# Patient Record
Sex: Female | Born: 1990 | Race: White | Hispanic: No | Marital: Married | Smoking: Former smoker
Health system: Southern US, Community
[De-identification: ages and names within clinical notes are randomized; demographics above are authoritative.]

## PROBLEM LIST (undated history)

## (undated) ENCOUNTER — Inpatient Hospital Stay (HOSPITAL_COMMUNITY): Payer: Self-pay

## (undated) DIAGNOSIS — N200 Calculus of kidney: Secondary | ICD-10-CM

## (undated) DIAGNOSIS — Z800 Family history of malignant neoplasm of digestive organs: Secondary | ICD-10-CM

## (undated) DIAGNOSIS — D649 Anemia, unspecified: Secondary | ICD-10-CM

## (undated) DIAGNOSIS — F32A Depression, unspecified: Secondary | ICD-10-CM

## (undated) DIAGNOSIS — O021 Missed abortion: Secondary | ICD-10-CM

## (undated) DIAGNOSIS — F329 Major depressive disorder, single episode, unspecified: Secondary | ICD-10-CM

## (undated) DIAGNOSIS — F419 Anxiety disorder, unspecified: Secondary | ICD-10-CM

## (undated) DIAGNOSIS — Z8041 Family history of malignant neoplasm of ovary: Secondary | ICD-10-CM

## (undated) DIAGNOSIS — N926 Irregular menstruation, unspecified: Secondary | ICD-10-CM

## (undated) DIAGNOSIS — H3530 Unspecified macular degeneration: Secondary | ICD-10-CM

## (undated) DIAGNOSIS — G4700 Insomnia, unspecified: Secondary | ICD-10-CM

## (undated) DIAGNOSIS — E611 Iron deficiency: Secondary | ICD-10-CM

## (undated) DIAGNOSIS — F988 Other specified behavioral and emotional disorders with onset usually occurring in childhood and adolescence: Secondary | ICD-10-CM

## (undated) DIAGNOSIS — R002 Palpitations: Secondary | ICD-10-CM

## (undated) DIAGNOSIS — Z87442 Personal history of urinary calculi: Secondary | ICD-10-CM

## (undated) DIAGNOSIS — Z803 Family history of malignant neoplasm of breast: Secondary | ICD-10-CM

## (undated) DIAGNOSIS — K219 Gastro-esophageal reflux disease without esophagitis: Secondary | ICD-10-CM

## (undated) DIAGNOSIS — Z3493 Encounter for supervision of normal pregnancy, unspecified, third trimester: Secondary | ICD-10-CM

## (undated) DIAGNOSIS — F410 Panic disorder [episodic paroxysmal anxiety] without agoraphobia: Secondary | ICD-10-CM

## (undated) DIAGNOSIS — N912 Amenorrhea, unspecified: Secondary | ICD-10-CM

## (undated) DIAGNOSIS — G43909 Migraine, unspecified, not intractable, without status migrainosus: Secondary | ICD-10-CM

## (undated) DIAGNOSIS — R5382 Chronic fatigue, unspecified: Secondary | ICD-10-CM

## (undated) HISTORY — DX: Irregular menstruation, unspecified: N92.6

## (undated) HISTORY — DX: Chronic fatigue, unspecified: R53.82

## (undated) HISTORY — DX: Other specified behavioral and emotional disorders with onset usually occurring in childhood and adolescence: F98.8

## (undated) HISTORY — DX: Family history of malignant neoplasm of digestive organs: Z80.0

## (undated) HISTORY — DX: Insomnia, unspecified: G47.00

## (undated) HISTORY — DX: Amenorrhea, unspecified: N91.2

## (undated) HISTORY — DX: Iron deficiency: E61.1

## (undated) HISTORY — DX: Migraine, unspecified, not intractable, without status migrainosus: G43.909

## (undated) HISTORY — DX: Calculus of kidney: N20.0

## (undated) HISTORY — DX: Family history of malignant neoplasm of ovary: Z80.41

## (undated) HISTORY — DX: Unspecified macular degeneration: H35.30

## (undated) HISTORY — DX: Panic disorder (episodic paroxysmal anxiety): F41.0

## (undated) HISTORY — DX: Palpitations: R00.2

## (undated) HISTORY — DX: Family history of malignant neoplasm of breast: Z80.3

## (undated) HISTORY — PX: OTHER SURGICAL HISTORY: SHX169

---

## 2013-12-29 ENCOUNTER — Emergency Department (HOSPITAL_COMMUNITY)
Admission: EM | Admit: 2013-12-29 | Discharge: 2013-12-30 | Disposition: A | Discharge status: Home / Self Care | Payer: PRIVATE HEALTH INSURANCE | Attending: Emergency Medicine | Admitting: Emergency Medicine

## 2013-12-29 ENCOUNTER — Encounter (HOSPITAL_COMMUNITY): Payer: Self-pay | Admitting: Emergency Medicine

## 2013-12-29 DIAGNOSIS — O2340 Unspecified infection of urinary tract in pregnancy, unspecified trimester: Secondary | ICD-10-CM

## 2013-12-29 DIAGNOSIS — O2390 Unspecified genitourinary tract infection in pregnancy, unspecified trimester: Secondary | ICD-10-CM | POA: Insufficient documentation

## 2013-12-29 DIAGNOSIS — N390 Urinary tract infection, site not specified: Secondary | ICD-10-CM | POA: Insufficient documentation

## 2013-12-29 DIAGNOSIS — R52 Pain, unspecified: Secondary | ICD-10-CM | POA: Insufficient documentation

## 2013-12-29 DIAGNOSIS — Z87442 Personal history of urinary calculi: Secondary | ICD-10-CM | POA: Insufficient documentation

## 2013-12-29 DIAGNOSIS — Z87891 Personal history of nicotine dependence: Secondary | ICD-10-CM | POA: Insufficient documentation

## 2013-12-29 DIAGNOSIS — R000 Tachycardia, unspecified: Secondary | ICD-10-CM | POA: Insufficient documentation

## 2013-12-29 LAB — CBC WITH DIFFERENTIAL/PLATELET
BASOS ABS: 0 10*3/uL (ref 0.0–0.1)
Basophils Relative: 0 % (ref 0–1)
EOS PCT: 0 % (ref 0–5)
Eosinophils Absolute: 0 10*3/uL (ref 0.0–0.7)
HCT: 35.7 % — ABNORMAL LOW (ref 36.0–46.0)
Hemoglobin: 12.5 g/dL (ref 12.0–15.0)
LYMPHS PCT: 5 % — AB (ref 12–46)
Lymphs Abs: 0.4 10*3/uL — ABNORMAL LOW (ref 0.7–4.0)
MCH: 30.9 pg (ref 26.0–34.0)
MCHC: 35 g/dL (ref 30.0–36.0)
MCV: 88.1 fL (ref 78.0–100.0)
Monocytes Absolute: 0.8 10*3/uL (ref 0.1–1.0)
Monocytes Relative: 10 % (ref 3–12)
NEUTROS ABS: 6.4 10*3/uL (ref 1.7–7.7)
NEUTROS PCT: 85 % — AB (ref 43–77)
PLATELETS: 173 10*3/uL (ref 150–400)
RBC: 4.05 MIL/uL (ref 3.87–5.11)
RDW: 12.4 % (ref 11.5–15.5)
WBC: 7.5 10*3/uL (ref 4.0–10.5)

## 2013-12-29 LAB — COMPREHENSIVE METABOLIC PANEL
ALK PHOS: 58 U/L (ref 39–117)
ALT: 17 U/L (ref 0–35)
AST: 17 U/L (ref 0–37)
Albumin: 4 g/dL (ref 3.5–5.2)
BUN: 9 mg/dL (ref 6–23)
CALCIUM: 9.3 mg/dL (ref 8.4–10.5)
CO2: 21 meq/L (ref 19–32)
Chloride: 97 mEq/L (ref 96–112)
Creatinine, Ser: 0.66 mg/dL (ref 0.50–1.10)
GFR calc Af Amer: >90 mL/min (ref >90)
Glucose, Bld: 95 mg/dL (ref 70–99)
POTASSIUM: 3.2 meq/L — AB (ref 3.7–5.3)
SODIUM: 133 meq/L — AB (ref 137–147)
Total Bilirubin: 0.4 mg/dL (ref 0.3–1.2)
Total Protein: 7.2 g/dL (ref 6.0–8.3)

## 2013-12-29 LAB — HCG, QUANTITATIVE, PREGNANCY: HCG, BETA CHAIN, QUANT, S: 353 m[IU]/mL — AB (ref <5)

## 2013-12-29 MED ORDER — ONDANSETRON HCL 4 MG/2ML IJ SOLN
4.0000 mg | Freq: Once | INTRAMUSCULAR | Status: AC
  Administered 2013-12-29: 4 mg via INTRAVENOUS
  Filled 2013-12-29: qty 2

## 2013-12-29 MED ORDER — SODIUM CHLORIDE 0.9 % IV BOLUS (SEPSIS)
1000.0000 mL | Freq: Once | INTRAVENOUS | Status: AC
  Administered 2013-12-29: 1000 mL via INTRAVENOUS

## 2013-12-29 NOTE — ED Provider Notes (Signed)
CSN: [id number]     Arrival date & time 12/29/13  2153 History   First MD Initiated Contact with Patient 12/29/13 2201     Chief Complaint  Patient presents with  . Fever  . Generalized Body Aches  . Urinary Tract Infection     (Consider location/radiation/quality/duration/timing/severity/associated sxs/prior Treatment) HPI Comments: Patient is a G93 P38 23 year old female presenting to the emergency department for 6 days of dysuria. Patient states last evening she developed some lower abdominal discomfort with radiation to the low back, along with fever (TMAX 101F), myalgias today. Patient states she's tried Tylenol at home with no improvement of her symptoms. Alleviating or aggravating factors. Patient denies any nausea, vomiting, vaginal bleeding or discharge. No OB/GYN.  Patient is a 23 y.o. female presenting with fever and urinary tract infection.  Fever Associated symptoms: chills and dysuria   Associated symptoms: no diarrhea and no nausea   Urinary Tract Infection Associated symptoms include abdominal pain, chills and a fever. Pertinent negatives include no nausea.    Past Medical History  Diagnosis Date  . Kidney stone    Past Surgical History  Procedure Laterality Date  . Kidney stent     No family history on file. History  Substance Use Topics  . Smoking status: Former Research scientist (life sciences)  . Smokeless tobacco: Never Used  . Alcohol Use: No   OB History   Grav Para Term Preterm Abortions TAB SAB Ect Mult Living   1              Review of Systems  Constitutional: Positive for fever and chills.  Gastrointestinal: Positive for abdominal pain. Negative for nausea, diarrhea and constipation.  Genitourinary: Positive for dysuria, urgency and decreased urine volume. Negative for hematuria, flank pain, vaginal bleeding, vaginal discharge, vaginal pain, menstrual problem and pelvic pain.  Musculoskeletal: Positive for back pain.  All other systems reviewed and are  negative.     Allergies  Review of patient's allergies indicates not on file.  Home Medications   Prior to Admission medications   Not on File   BP 135/85  Pulse 130  Temp(Src) 99 F (37.2 C) (Oral)  Resp 24  SpO2 97%  LMP 12/01/2013 Physical Exam  Nursing note and vitals reviewed. Constitutional: She is oriented to person, place, and time. She appears well-developed and well-nourished. No distress.  Patient tearful.   HENT:  Head: Normocephalic and atraumatic.  Right Ear: External ear normal.  Left Ear: External ear normal.  Nose: Nose normal.  Mouth/Throat: Oropharynx is clear and moist. No oropharyngeal exudate.  Eyes: Conjunctivae are normal.  Neck: Normal range of motion. Neck supple.  Cardiovascular: Regular rhythm and normal heart sounds.  Tachycardia present.   Pulmonary/Chest: Effort normal and breath sounds normal. No respiratory distress.  Abdominal: Soft. Bowel sounds are normal. She exhibits no distension. There is tenderness (mildly tender in lower quadrants). There is no rigidity, no rebound, no guarding and no CVA tenderness.  Musculoskeletal: Normal range of motion.  Neurological: She is alert and oriented to person, place, and time.  Skin: Skin is warm and dry. She is not diaphoretic.  Psychiatric: She has a normal mood and affect.    ED Course  Procedures (including critical care time) Medications  sodium chloride 0.9 % bolus 1,000 mL (1,000 mLs Intravenous New Bag/Given 12/29/13 2255)    Labs Review Labs Reviewed - No data to display  Imaging Review No results found.   EKG Interpretation None      MDM  Final diagnoses:  UTI in pregnancy    Filed Vitals:   12/30/13 0230  BP: 109/57  Pulse: 118  Temp:   Resp: 16   Afebrile, NAD, non-toxic appearing, AAOx4.  Patient presenting with 1 week of dysuria with one day of fevers, chills, and suprapubic abdominal discomfort. Patient initially very anxious, tearful, and tachycardiac upon  arrival. Abdomen is soft, mildly tender in suprapubic region, no peritoneal signs. No CVA tenderness. Pelvic exam is unremarkable. Screening labs without acute abnormality. No gross leukocytosis. Urine results of evidence of infection. Tachycardia improved with IV fluids. Advised patient to followup with OB/GYN to establish care. The patient was signed out to [employer], PA-C and improvement in tachycardia with IV fluids. Patient d/w with Dr. Lita Mains, agrees with plan.    Harlow Mares, PA-C 12/30/13 1510

## 2013-12-29 NOTE — ED Notes (Signed)
Pt states last Thursday started having burning and frequency w/ urination, states that's what prompted her to take a pregnancy test which came back positive, states she is between 3-[redacted] weeks pregnant, has not seen her OB/GYN yet, states today started having fever, body aches, lower abdominal cramping and lower back pain, pt states headache also present, has taken tylenol twice today, states last time took was 1 extra strength tylenol and a tylenol PM around 2115.

## 2013-12-30 LAB — URINALYSIS, ROUTINE W REFLEX MICROSCOPIC
Bilirubin Urine: NEGATIVE
GLUCOSE, UA: NEGATIVE mg/dL
Ketones, ur: 40 mg/dL — AB
NITRITE: NEGATIVE
PH: 6.5 (ref 5.0–8.0)
Protein, ur: NEGATIVE mg/dL
SPECIFIC GRAVITY, URINE: 1.005 (ref 1.005–1.030)
Urobilinogen, UA: 0.2 mg/dL (ref 0.0–1.0)

## 2013-12-30 LAB — URINE MICROSCOPIC-ADD ON

## 2013-12-30 LAB — WET PREP, GENITAL
CLUE CELLS WET PREP: NONE SEEN
Trich, Wet Prep: NONE SEEN
WBC, Wet Prep HPF POC: NONE SEEN
Yeast Wet Prep HPF POC: NONE SEEN

## 2013-12-30 LAB — GC/CHLAMYDIA PROBE AMP
CT Probe RNA: NEGATIVE
GC PROBE AMP APTIMA: NEGATIVE

## 2013-12-30 LAB — PREGNANCY, URINE: PREG TEST UR: POSITIVE — AB

## 2013-12-30 LAB — HIV ANTIBODY (ROUTINE TESTING W REFLEX): HIV 1&2 Ab, 4th Generation: NONREACTIVE

## 2013-12-30 MED ORDER — ONDANSETRON HCL 4 MG PO TABS: 4.0000 mg | ORAL_TABLET | Freq: Four times a day (QID) | ORAL | Status: DC

## 2013-12-30 MED ORDER — NITROFURANTOIN MONOHYD MACRO 100 MG PO CAPS
100.0000 mg | ORAL_CAPSULE | Freq: Once | ORAL | Status: AC
  Administered 2013-12-30: 100 mg via ORAL
  Filled 2013-12-30: qty 1

## 2013-12-30 MED ORDER — NITROFURANTOIN MONOHYD MACRO 100 MG PO CAPS: 100.0000 mg | ORAL_CAPSULE | Freq: Two times a day (BID) | ORAL | Status: DC
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
## 2013-12-30 NOTE — ED Notes (Signed)
See downtown forms for previous notes.

## 2014-01-02 NOTE — ED Provider Notes (Signed)
Medical screening examination/treatment/procedure(s) were performed by non-physician practitioner and as supervising physician I was immediately available for consultation/collaboration.   EKG Interpretation None       Jasper Riling. Alvino Chapel, MD 01/02/14 [phone]

## 2014-02-21 ENCOUNTER — Inpatient Hospital Stay (HOSPITAL_COMMUNITY)
Admission: EM | Admit: 2014-02-21 | Discharge: 2014-02-22 | DRG: 781 | Disposition: A | Discharge status: Home / Self Care | Payer: 59 | Attending: Internal Medicine | Admitting: Internal Medicine

## 2014-02-21 ENCOUNTER — Inpatient Hospital Stay (HOSPITAL_COMMUNITY): Payer: 59

## 2014-02-21 ENCOUNTER — Emergency Department (HOSPITAL_COMMUNITY): Payer: 59

## 2014-02-21 ENCOUNTER — Encounter (HOSPITAL_COMMUNITY): Payer: Self-pay | Admitting: Emergency Medicine

## 2014-02-21 DIAGNOSIS — N1330 Unspecified hydronephrosis: Secondary | ICD-10-CM | POA: Diagnosis present

## 2014-02-21 DIAGNOSIS — Z3490 Encounter for supervision of normal pregnancy, unspecified, unspecified trimester: Secondary | ICD-10-CM

## 2014-02-21 DIAGNOSIS — R21 Rash and other nonspecific skin eruption: Secondary | ICD-10-CM | POA: Diagnosis present

## 2014-02-21 DIAGNOSIS — N200 Calculus of kidney: Secondary | ICD-10-CM | POA: Diagnosis present

## 2014-02-21 DIAGNOSIS — O209 Hemorrhage in early pregnancy, unspecified: Secondary | ICD-10-CM | POA: Diagnosis present

## 2014-02-21 DIAGNOSIS — O26839 Pregnancy related renal disease, unspecified trimester: Principal | ICD-10-CM | POA: Diagnosis present

## 2014-02-21 DIAGNOSIS — E876 Hypokalemia: Secondary | ICD-10-CM | POA: Diagnosis present

## 2014-02-21 DIAGNOSIS — O468X1 Other antepartum hemorrhage, first trimester: Secondary | ICD-10-CM

## 2014-02-21 DIAGNOSIS — R112 Nausea with vomiting, unspecified: Secondary | ICD-10-CM | POA: Diagnosis present

## 2014-02-21 DIAGNOSIS — D72829 Elevated white blood cell count, unspecified: Secondary | ICD-10-CM

## 2014-02-21 DIAGNOSIS — O208 Other hemorrhage in early pregnancy: Secondary | ICD-10-CM

## 2014-02-21 DIAGNOSIS — Z87442 Personal history of urinary calculi: Secondary | ICD-10-CM | POA: Diagnosis not present

## 2014-02-21 DIAGNOSIS — E871 Hypo-osmolality and hyponatremia: Secondary | ICD-10-CM | POA: Diagnosis present

## 2014-02-21 DIAGNOSIS — D649 Anemia, unspecified: Secondary | ICD-10-CM

## 2014-02-21 DIAGNOSIS — O418X10 Other specified disorders of amniotic fluid and membranes, first trimester, not applicable or unspecified: Secondary | ICD-10-CM

## 2014-02-21 DIAGNOSIS — R109 Unspecified abdominal pain: Secondary | ICD-10-CM | POA: Diagnosis present

## 2014-02-21 DIAGNOSIS — Z331 Pregnant state, incidental: Secondary | ICD-10-CM

## 2014-02-21 LAB — BASIC METABOLIC PANEL
Anion gap: 15 (ref 5–15)
BUN: 9 mg/dL (ref 6–23)
CHLORIDE: 100 meq/L (ref 96–112)
CO2: 21 meq/L (ref 19–32)
Calcium: 9.6 mg/dL (ref 8.4–10.5)
Creatinine, Ser: 0.6 mg/dL (ref 0.50–1.10)
GFR calc Af Amer: >90 mL/min (ref >90)
GFR calc non Af Amer: >90 mL/min (ref >90)
Glucose, Bld: 89 mg/dL (ref 70–99)
POTASSIUM: 3.6 meq/L — AB (ref 3.7–5.3)
Sodium: 136 mEq/L — ABNORMAL LOW (ref 137–147)

## 2014-02-21 LAB — URINE MICROSCOPIC-ADD ON

## 2014-02-21 LAB — POC URINE PREG, ED: PREG TEST UR: POSITIVE — AB

## 2014-02-21 LAB — URINALYSIS, ROUTINE W REFLEX MICROSCOPIC
Bilirubin Urine: NEGATIVE
Glucose, UA: NEGATIVE mg/dL
Ketones, ur: 40 mg/dL — AB
Leukocytes, UA: NEGATIVE
Nitrite: NEGATIVE
PH: 7 (ref 5.0–8.0)
Protein, ur: NEGATIVE mg/dL
SPECIFIC GRAVITY, URINE: 1.016 (ref 1.005–1.030)
Urobilinogen, UA: 0.2 mg/dL (ref 0.0–1.0)

## 2014-02-21 LAB — CBC
HCT: 35.1 % — ABNORMAL LOW (ref 36.0–46.0)
HEMOGLOBIN: 12.5 g/dL (ref 12.0–15.0)
MCH: 30.9 pg (ref 26.0–34.0)
MCHC: 35.6 g/dL (ref 30.0–36.0)
MCV: 86.7 fL (ref 78.0–100.0)
Platelets: 189 10*3/uL (ref 150–400)
RBC: 4.05 MIL/uL (ref 3.87–5.11)
RDW: 13 % (ref 11.5–15.5)
WBC: 9.1 10*3/uL (ref 4.0–10.5)

## 2014-02-21 MED ORDER — OXYCODONE HCL 5 MG PO TABS
5.0000 mg | ORAL_TABLET | ORAL | Status: DC | PRN
  Filled 2014-02-21: qty 1

## 2014-02-21 MED ORDER — ACETAMINOPHEN 650 MG RE SUPP: 650.0000 mg | Freq: Four times a day (QID) | RECTAL | Status: DC | PRN

## 2014-02-21 MED ORDER — ACETAMINOPHEN 325 MG PO TABS: 650.0000 mg | ORAL_TABLET | Freq: Four times a day (QID) | ORAL | Status: DC | PRN

## 2014-02-21 MED ORDER — DIPHENHYDRAMINE HCL 25 MG PO CAPS
25.0000 mg | ORAL_CAPSULE | Freq: Four times a day (QID) | ORAL | Status: DC | PRN
  Filled 2014-02-21: qty 1

## 2014-02-21 MED ORDER — ONDANSETRON HCL 4 MG PO TABS: 4.0000 mg | ORAL_TABLET | Freq: Four times a day (QID) | ORAL | Status: DC | PRN

## 2014-02-21 MED ORDER — DEXTROSE 5 % IV SOLN
1.0000 g | INTRAVENOUS | Status: DC
  Administered 2014-02-21: 1 g via INTRAVENOUS
  Filled 2014-02-21 (×3): qty 10

## 2014-02-21 MED ORDER — CYCLOBENZAPRINE HCL 5 MG PO TABS
5.0000 mg | ORAL_TABLET | Freq: Once | ORAL | Status: AC
  Administered 2014-02-21: 5 mg via ORAL
  Filled 2014-02-21: qty 1

## 2014-02-21 MED ORDER — HYDROMORPHONE HCL 1 MG/ML PO LIQD: 1.0000 mg | ORAL | Status: DC | PRN

## 2014-02-21 MED ORDER — HYDROMORPHONE HCL PF 1 MG/ML IJ SOLN
1.0000 mg | INTRAMUSCULAR | Status: DC | PRN
  Administered 2014-02-21 (×6): 1 mg via INTRAVENOUS
  Filled 2014-02-21 (×6): qty 1

## 2014-02-21 MED ORDER — PRENATAL MULTIVITAMIN CH
1.0000 | ORAL_TABLET | Freq: Every day | ORAL | Status: DC
  Administered 2014-02-22: 1 via ORAL
  Filled 2014-02-21: qty 1

## 2014-02-21 MED ORDER — ALBUTEROL SULFATE (2.5 MG/3ML) 0.083% IN NEBU: 2.5000 mg | INHALATION_SOLUTION | RESPIRATORY_TRACT | Status: DC | PRN

## 2014-02-21 MED ORDER — SODIUM CHLORIDE 0.9 % IV SOLN
Freq: Once | INTRAVENOUS | Status: AC
  Administered 2014-02-21: 12:00:00 via INTRAVENOUS

## 2014-02-21 MED ORDER — HYDROMORPHONE HCL PF 1 MG/ML IJ SOLN
2.0000 mg | Freq: Once | INTRAMUSCULAR | Status: AC
  Administered 2014-02-21: 2 mg via INTRAVENOUS
  Filled 2014-02-21: qty 2

## 2014-02-21 MED ORDER — ONDANSETRON HCL 4 MG/2ML IJ SOLN
4.0000 mg | Freq: Once | INTRAMUSCULAR | Status: AC
  Administered 2014-02-21: 4 mg via INTRAVENOUS
  Filled 2014-02-21: qty 2

## 2014-02-21 MED ORDER — SODIUM CHLORIDE 0.9 % IV BOLUS (SEPSIS)
1000.0000 mL | Freq: Once | INTRAVENOUS | Status: AC
  Administered 2014-02-21: 1000 mL via INTRAVENOUS

## 2014-02-21 MED ORDER — HYDROMORPHONE HCL PF 1 MG/ML IJ SOLN
1.0000 mg | INTRAMUSCULAR | Status: DC | PRN
  Administered 2014-02-21 (×3): 1 mg via INTRAVENOUS
  Filled 2014-02-21 (×3): qty 1

## 2014-02-21 MED ORDER — DEXTROSE 5 % IV SOLN
1.0000 g | INTRAVENOUS | Status: DC
  Filled 2014-02-21: qty 10

## 2014-02-21 MED ORDER — HYDROMORPHONE HCL PF 1 MG/ML IJ SOLN
1.0000 mg | Freq: Once | INTRAMUSCULAR | Status: AC
  Administered 2014-02-21: 1 mg via INTRAVENOUS
  Filled 2014-02-21: qty 1

## 2014-02-21 MED ORDER — SODIUM CHLORIDE 0.9 % IV SOLN: INTRAVENOUS | Status: AC

## 2014-02-21 MED ORDER — ONDANSETRON HCL 4 MG/2ML IJ SOLN
4.0000 mg | Freq: Four times a day (QID) | INTRAMUSCULAR | Status: DC | PRN
Start: 2014-02-21 — End: 2014-02-22
  Administered 2014-02-21: 4 mg via INTRAVENOUS
  Filled 2014-02-21: qty 2

## 2014-02-21 NOTE — ED Notes (Signed)
Patient still in radiology at this time; [facility] nurse given report with update on transfer to come once patient returns from radiology.

## 2014-02-21 NOTE — ED Notes (Signed)
Patient transported to Ultrasound 

## 2014-02-21 NOTE — Consult Note (Signed)
Consult: left flank pain and left hydronephrosis Requested by: Orlie Dakin, MD  H&P  History of Present Illness: 23 year old female who developed severe left flank pain last night. It initially went away but recurred today while she was at work. She's also had some lower urinary tract symptoms consisting of some dysuria and bladder pressure. She's continued to require IV pain medication throughout the day for uncontrolled pain. She denies any fevers chills nausea or vomiting.  She underwent a renal ultrasound which showed left mild to moderate hydronephrosis and no left ureteral jet. No stones were noted and specifically none near the left UVJ. She underwent transvaginal ultrasound which confirmed an intrauterine pregnancy and again no stones were seen near the bladder on the ultrasound. Finally, she underwent MRI without contrast which shows mild to moderate left hydronephrosis, some fluid around the left kidney, no dilation of the ureter and no obvious filling defect. One area where we could no longer follow the ureter was close to the ureterovesical junction. I reviewed the images of all 3 studies and reviewed them with Dr. Maryland Pink.   The patient has a history of kidney stones. Per her report she underwent what sounds like right ureteroscopy and stent placement last year for a 10 mm ureteral stone. In fact one of the first thing she told me she would not want another stent.    Past Medical History  Diagnosis Date  . Kidney stone    Past Surgical History  Procedure Laterality Date  . Kidney stent      Home Medications:   (Not in a hospital admission) Allergies: No Known Allergies  Family History  Problem Relation Age of Onset  . Breast cancer Other    Social History:  reports that she has quit smoking. She has never used smokeless tobacco. She reports that she does not drink alcohol or use illicit drugs.  ROS: A complete review of systems was performed.  All systems are  negative except for pertinent findings as noted. Review of Systems  All other systems reviewed and are negative.    Physical Exam:  Vital signs in last 24 hours: Temp:  [97.8 F (36.6 C)] 97.8 F (36.6 C) (08/02 0913) Pulse Rate:  [79-115] 115 (08/02 1330) Resp:  [20-26] 20 (08/02 1330) BP: (117-146)/(56-102) 122/70 mmHg (08/02 1330) SpO2:  [97 %-100 %] 100 % (08/02 1330) Weight:  [57.607 kg (127 lb)] 57.607 kg (127 lb) (08/02 0911) General:  Alert and oriented, No acute distress; initially very anxious and emotionally labile with crying. Later she became very calm, had minimal pain and was laughing.  HEENT: Normocephalic, atraumatic Neck: No JVD or lymphadenopathy Cardiovascular: Regular rate and rhythm Lungs: Regular rate and effort Abdomen: Soft, nontender, nondistended, no abdominal masses Back: No CVA tenderness Extremities: No edema Neurologic: Grossly intact  Interviewed and examined with mom and boyfriend in the room.  Laboratory Data:  Results for orders placed during the hospital encounter of 02/21/14 (from the past 24 hour(s))  BASIC METABOLIC PANEL     Status: Abnormal   Collection Time    02/21/14  9:20 AM      Result Value Ref Range   Sodium 136 (*) 137 - 147 mEq/L   Potassium 3.6 (*) 3.7 - 5.3 mEq/L   Chloride 100  96 - 112 mEq/L   CO2 21  19 - 32 mEq/L   Glucose, Bld 89  70 - 99 mg/dL   BUN 9  6 - 23 mg/dL   Creatinine, Ser  0.60  0.50 - 1.10 mg/dL   Calcium 9.6  8.4 - 10.5 mg/dL   GFR calc non Af Amer >90  >90 mL/min   GFR calc Af Amer >90  >90 mL/min   Anion gap 15  5 - 15  CBC     Status: Abnormal   Collection Time    02/21/14  9:20 AM      Result Value Ref Range   WBC 9.1  4.0 - 10.5 K/uL   RBC 4.05  3.87 - 5.11 MIL/uL   Hemoglobin 12.5  12.0 - 15.0 g/dL   HCT 35.1 (*) 36.0 - 46.0 %   MCV 86.7  78.0 - 100.0 fL   MCH 30.9  26.0 - 34.0 pg   MCHC 35.6  30.0 - 36.0 g/dL   RDW 13.0  11.5 - 15.5 %   Platelets 189  150 - 400 K/uL  URINALYSIS,  ROUTINE W REFLEX MICROSCOPIC     Status: Abnormal   Collection Time    02/21/14 11:33 AM      Result Value Ref Range   Color, Urine YELLOW  YELLOW   APPearance CLEAR  CLEAR   Specific Gravity, Urine 1.016  1.005 - 1.030   pH 7.0  5.0 - 8.0   Glucose, UA NEGATIVE  NEGATIVE mg/dL   Hgb urine dipstick TRACE (*) NEGATIVE   Bilirubin Urine NEGATIVE  NEGATIVE   Ketones, ur 40 (*) NEGATIVE mg/dL   Protein, ur NEGATIVE  NEGATIVE mg/dL   Urobilinogen, UA 0.2  0.0 - 1.0 mg/dL   Nitrite NEGATIVE  NEGATIVE   Leukocytes, UA NEGATIVE  NEGATIVE  URINE MICROSCOPIC-ADD ON     Status: None   Collection Time    02/21/14 11:33 AM      Result Value Ref Range   Squamous Epithelial / LPF RARE  RARE   RBC / HPF 0-2  <3 RBC/hpf  POC URINE PREG, ED     Status: Abnormal   Collection Time    02/21/14 11:38 AM      Result Value Ref Range   Preg Test, Ur POSITIVE (*) NEGATIVE   No results found for this or any previous visit (from the past 240 hour(s)). Creatinine:  Recent Labs  02/21/14 0920  CREATININE 0.60    Impression/Assessment/plan - -Intrauterine pregnancy-confirmed on ultrasound today -Left hydronephrosis, left flank pain - I discussed with the patient, her mom and boyfriend that she has an unclear etiology for the left hydronephrosis and flank pain, but I suspect she is passing a small stone.  We discussed renal colic will typically improve 6-8 hours after the initial severe attack and about 85% of pregnant patients will pass small kidney stones spontaneously. The patient has normal vitals and labs therefore doesn't need urgent intervention.  If her pain continues or increases again we discussed alternatives such as cystoscopy with left ureteroscopy potential holmium laser lithotripsy, stone basket extraction and ureteral stent placement. The benefit of ureteroscopy would be to potentially remove a ureteral stone and eliminate the need for stent changes over the course of the pregnancy. Otherwise  we would need to consider left nephrostomy tube. Both stents and nephrostomy tubes need to be changed about every 6 weeks for the duration of the pregnancy, but nephrostomy tubes tend to be easier to exchange and associated with less pain. Stents can be as painful as a stone and sometimes worse. In fact, when I met the patient today the first thing she said was she did not  want another stent. They will consider the options and hopefully she will pass a stone in the coming hours. If her pain improves by tomorrow and she remained stable she could be discharged with observation. It's not uncommon to require multiple admissions for pain control and IV hydration as well as fetal monitoring when attempting to pass a stone.   Festus Aloe 02/21/2014, 2:41 PM

## 2014-02-21 NOTE — ED Notes (Signed)
Pt c/o left flank pain onset yesterday. Pt reports pain went away and came back again this morning. Pt is [redacted] weeks pregnant and has history of kidney stone. Pt reports pain feels like her kidney stone pain in the past.

## 2014-02-21 NOTE — ED Provider Notes (Signed)
CSN: [id number]     Arrival date & time 02/21/14  0903 History   First MD Initiated Contact with Patient 02/21/14 [phone]     Chief Complaint  Patient presents with  . Flank Pain     (Consider location/radiation/quality/duration/timing/severity/associated sxs/prior Treatment) The history is provided by the patient and medical records.    Imogen Maddalena Is a 23 year old 93 week G1 P0 female who presents with complaint of "kidney stone." The patient states that last night she began having left flank pain. At 1 point had 9/10 pain however it resolved and did not come back. He had associated burning with urination and a sensation that felt like "someone is pinching my urethra." The patient states this morning she went to work. 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 She began having worsening colicky left flank pain, 10 out of 10, nausea, or vomiting, pain radiates into the left groin. It is just like her previous stone. Her kidney stone occurred when it worked. She was seen at [facility]. She was found to have a 10 mm stone requiring stent placement. She has a family history of kidney stones in both her mother and her father. On arrival patient was wheeled in crying loudly and actively vomiting. She denies any other urinary symptoms such as hematuria.    Past Medical History  Diagnosis Date  . Kidney stone    Past Surgical History  Procedure Laterality Date  . Kidney stent     No family history on file. History  Substance Use Topics  . Smoking status: Former Research scientist (life sciences)  . Smokeless tobacco: Never Used  . Alcohol Use: No   OB History   Grav Para Term Preterm Abortions TAB SAB Ect Mult Living   1              Review of Systems  Ten systems reviewed and are negative for acute change, except as noted in the HPI.    Allergies  Review of patient's allergies indicates no known allergies.  Home Medications   Prior to Admission medications   Medication Sig Start Date End  Date Taking? Authorizing Provider  acetaminophen (TYLENOL) 500 MG tablet Take 500 mg by mouth every 6 (six) hours as needed for mild pain.    Historical Provider, MD  diphenhydramine-acetaminophen (TYLENOL PM) 25-500 MG TABS Take 1 tablet by mouth at bedtime as needed (sleep).    Historical Provider, MD  hydrOXYzine (ATARAX/VISTARIL) 25 MG tablet Take 25 mg by mouth 3 (three) times daily as needed for anxiety.    Historical Provider, MD  nitrofurantoin, macrocrystal-monohydrate, (MACROBID) 100 MG capsule Take 1 capsule (100 mg total) by mouth 2 (two) times daily. 12/30/13   Antonietta Breach, PA-C  omega-3 acid ethyl esters (LOVAZA) 1 G capsule Take 1 g by mouth daily.    Historical Provider, MD  ondansetron (ZOFRAN) 4 MG tablet Take 1 tablet (4 mg total) by mouth every 6 (six) hours. 12/30/13   Antonietta Breach, PA-C  Prenatal Vit-Fe Fumarate-FA (PRENATAL MULTIVITAMIN) TABS tablet Take 1 tablet by mouth daily at 12 noon.    Historical Provider, MD   BP 146/102  Pulse 113  Temp(Src) 97.8 F (36.6 C) (Oral)  Resp 26  Ht 5\' 3"  (1.6 m)  Wt 127 lb (57.607 kg)  BMI 22.50 kg/m2  SpO2 100%  LMP 12/01/2013 Physical Exam  Nursing note and vitals reviewed. Constitutional: She is oriented to person, place, and time. She appears well-developed and well-nourished.  Patient appears  to be in extreme pain. Unable to sit still. She is feeding herself, actively vomiting. She is pulling off her clothes because she feels hot. Actively crying and retching.  HENT:  Head: Normocephalic and atraumatic.  Eyes: Conjunctivae are normal. No scleral icterus.  Neck: Normal range of motion.  Cardiovascular: Normal rate, regular rhythm and normal heart sounds.  Exam reveals no gallop and no friction rub.   No murmur heard. Pulmonary/Chest: Effort normal and breath sounds normal. No respiratory distress.  Abdominal: Soft. Bowel sounds are normal. She exhibits distension. She exhibits no mass. There is no tenderness. There is no  guarding.  Gravid uterus palpable to approx 4 cm above pubis. L Cva tenderness  Neurological: She is alert and oriented to person, place, and time.  Skin: Skin is warm and dry.    ED Course  Procedures (including critical care time) Labs Review Labs Reviewed  URINALYSIS, ROUTINE W REFLEX MICROSCOPIC  BASIC METABOLIC PANEL  CBC    Imaging Review No results found.   EKG Interpretation None      MDM   Final diagnoses:  Nephrolithiasis  Non-intractable vomiting with nausea, vomiting of unspecified type  Currently pregnant  Left sided abdominal pain    11:07 AM BP 117/100  Pulse 94  Temp(Src) 97.8 F (36.6 C) (Oral)  Resp 26  Ht 5\' 3"  (1.6 m)  Wt 127 lb (57.607 kg)  BMI 22.50 kg/m2  SpO2 97%  LMP 12/01/2013 Patient in sever pain despite multiple doses of dilaudid  [place] revels moderate hydronephrosis on the L. No stone is visulaized. Her urine is pending.  Patient continuing to have severe pain. i have spoken with Dr. Junious Silk who asks for a medicine admission.  He also asks for an MRI of the abdomen and pelvis to evaluate the stone. Her  Urine appears clear. No leukocytosis.    Patient accepted for admission by Dr. Maryland Pink. She will be transferred to [facility] long. Dr. Maryland Pink asks for OB US.    Margarita Mail, PA-C 02/22/14 1910

## 2014-02-21 NOTE — H&P (Addendum)
[facility] History and Physical  Renee Sanchez ELY:[id number] DOB: Sep 11, 1990 DOA: 02/21/2014   PCP: She does not have a primary care physician locally. She has moved here recently from [place]. Specialists: She has seen a urologist in [place] last year. Does not have OB yet.  Chief Complaint: Left-sided flank pain  HPI: Renee Sanchez is a 23 y.o. female with the past medical history of a kidney stone about a year ago, for which she required a stent placement to retrieve this stone. She hasn't had any further issues with kidney stones since then. She works as a Mudlogger at [employer]. About 2 days ago, she started having mild pain under her left rib cage area towards the back. Started radiating downwards. The following day she had some burning sensation in her bladder, especially while urinating. She consumed a lot of fluids, and was urinating a lot. She had some more pain in her back, but then the pain resolved after about 10-20 minutes. And, then she was working this morning when she started having excruciating pain in the left back. It started radiating downwards. Denies any blood in the urine. Started having nausea and multiple episodes of vomiting. Pain has been 10 out of 10 in intensity. Sharp pain. No fever. No chills. She does report that she is about [redacted] weeks pregnant. Denies any vaginal bleeding or discharge. Denies any diarrhea recently.  Home Medications: Prior to Admission medications   Medication Sig Start Date End Date Taking? Authorizing Provider  ondansetron (ZOFRAN) 4 MG tablet Take 1 tablet (4 mg total) by mouth every 6 (six) hours. 12/30/13  Yes Antonietta Breach, PA-C  Prenatal Vit-Fe Fumarate-FA (PRENATAL MULTIVITAMIN) TABS tablet Take 1 tablet by mouth daily at 12 noon.   Yes Historical Provider, MD    Allergies: No Known Allergies  Past Medical History: Past Medical History  Diagnosis Date  . Kidney stone     Past Surgical History    Procedure Laterality Date  . Kidney stent      Social History: She lives in [place]. Denies any smoking, alcohol use illicit drug use. Works as a Mudlogger. Independent with daily activities  Family History:  Family History  Problem Relation Age of Onset  . Breast cancer Other      Review of Systems - History obtained from the patient General ROS: positive for  - fatigue Psychological ROS: negative Ophthalmic ROS: negative ENT ROS: negative Allergy and Immunology ROS: negative Hematological and Lymphatic ROS: negative Endocrine ROS: negative Respiratory ROS: no cough, shortness of breath, or wheezing Cardiovascular ROS: no chest pain or dyspnea on exertion Gastrointestinal ROS: as in hpi Genito-Urinary ROS: as in hpi Musculoskeletal ROS: negative Neurological ROS: no TIA or stroke symptoms Dermatological ROS: negative  Physical Examination  Filed Vitals:   02/21/14 1130 02/21/14 1200 02/21/14 1300 02/21/14 1330  BP: 134/67 125/56 127/71 122/70  Pulse: 108 104 79 115  Temp:      TempSrc:      Resp: '24 24 20 20  ' Height:      Weight:      SpO2: 98% 99% 99% 100%    BP 122/70  Pulse 115  Temp(Src) 97.8 F (36.6 C) (Oral)  Resp 20  Ht '5\' 3"'  (1.6 m)  Wt 57.607 kg (127 lb)  BMI 22.50 kg/m2  SpO2 100%  LMP 12/01/2013  General appearance: alert, cooperative, appears stated age and no distress Head: Normocephalic, without obvious abnormality, atraumatic Throat: lips, mucosa, and tongue normal;  teeth and gums normal Back: no kyphosis present, no scoliosis present, some left CVA tenderness Resp: clear to auscultation bilaterally Cardio: regular rate and rhythm, S1, S2 normal, no murmur, click, rub or gallop GI: Abdomen is soft. Minimal tenderness in the left side. No rebound, rigidity, or guarding. OB examination was not performed Extremities: extremities normal, atraumatic, no cyanosis or edema Pulses: 2+ and symmetric Skin: She was noted to have a  diffuse macular rash over her abdomen, and back. But after a few minutes this had resolved.  Lymph nodes: Cervical, supraclavicular, and axillary nodes normal. Neurologic: Alert and oriented x3. No focal neurological deficits are present.  Laboratory Data: Results for orders placed during the hospital encounter of 02/21/14 (from the past 48 hour(s))  BASIC METABOLIC PANEL     Status: Abnormal   Collection Time    02/21/14  9:20 AM      Result Value Ref Range   Sodium 136 (*) 137 - 147 mEq/L   Potassium 3.6 (*) 3.7 - 5.3 mEq/L   Chloride 100  96 - 112 mEq/L   CO2 21  19 - 32 mEq/L   Glucose, Bld 89  70 - 99 mg/dL   BUN 9  6 - 23 mg/dL   Creatinine, Ser 0.60  0.50 - 1.10 mg/dL   Calcium 9.6  8.4 - 10.5 mg/dL   GFR calc non Af Amer >90  >90 mL/min   GFR calc Af Amer >90  >90 mL/min   Comment: (NOTE)     The eGFR has been calculated using the CKD EPI equation.     This calculation has not been validated in all clinical situations.     eGFR's persistently <90 mL/min signify possible Chronic Kidney     Disease.   Anion gap 15  5 - 15  CBC     Status: Abnormal   Collection Time    02/21/14  9:20 AM      Result Value Ref Range   WBC 9.1  4.0 - 10.5 K/uL   RBC 4.05  3.87 - 5.11 MIL/uL   Hemoglobin 12.5  12.0 - 15.0 g/dL   HCT 35.1 (*) 36.0 - 46.0 %   MCV 86.7  78.0 - 100.0 fL   MCH 30.9  26.0 - 34.0 pg   MCHC 35.6  30.0 - 36.0 g/dL   RDW 13.0  11.5 - 15.5 %   Platelets 189  150 - 400 K/uL  URINALYSIS, ROUTINE W REFLEX MICROSCOPIC     Status: Abnormal   Collection Time    02/21/14 11:33 AM      Result Value Ref Range   Color, Urine YELLOW  YELLOW   APPearance CLEAR  CLEAR   Specific Gravity, Urine 1.016  1.005 - 1.030   pH 7.0  5.0 - 8.0   Glucose, UA NEGATIVE  NEGATIVE mg/dL   Hgb urine dipstick TRACE (*) NEGATIVE   Bilirubin Urine NEGATIVE  NEGATIVE   Ketones, ur 40 (*) NEGATIVE mg/dL   Protein, ur NEGATIVE  NEGATIVE mg/dL   Urobilinogen, UA 0.2  0.0 - 1.0 mg/dL   Nitrite  NEGATIVE  NEGATIVE   Leukocytes, UA NEGATIVE  NEGATIVE  URINE MICROSCOPIC-ADD ON     Status: None   Collection Time    02/21/14 11:33 AM      Result Value Ref Range   Squamous Epithelial / LPF RARE  RARE   RBC / HPF 0-2  <3 RBC/hpf  POC URINE PREG, ED  Status: Abnormal   Collection Time    02/21/14 11:38 AM      Result Value Ref Range   Preg Test, Ur POSITIVE (*) NEGATIVE   Comment:            THE SENSITIVITY OF THIS     METHODOLOGY IS >24 mIU/mL    Radiology Reports: US Renal  02/21/2014   CLINICAL DATA:  Pain, kidney stone, pregnant  EXAM: RENAL/URINARY TRACT ULTRASOUND COMPLETE  COMPARISON:  None.  FINDINGS: Right Kidney:  Length: 11.6 cm.  No mass or hydronephrosis.  Left Kidney:  Length: 11.7 cm.  Moderate hydronephrosis.  Bladder:  Within normal limits. Right bladder jet is visualized. A left bladder jet is not demonstrated.  IMPRESSION: Moderate left hydronephrosis, etiology unclear by ultrasound.  A left bladder jet is not demonstrated.   Electronically Signed   By: Julian Hy M.D.   On: 02/21/2014 10:59     Problem List  Principal Problem:   Nephrolithiasis Active Problems:   Left sided abdominal pain   Currently pregnant   Nausea and vomiting   Assessment: This is a 23 year old, Caucasian female, who is apparently, about [redacted] weeks pregnant, who comes in with left-sided flank pain. Her clinical history and examination is suspicious for another episode of renal colic due to a kidney stone. Pregnancy is a confounding factor in this presentation.  Plan: #1 left-sided flank pain, and abdominal pain: Ultrasound of the kidneys did reveal left-sided hydronephrosis. No stone was identified. Due to her pregnancy, she cannot undergo CT scan. Case was discussed with urology and they recommended MRI to locate the stone. She will be treated symptomatically. Urology requests transfer to [facility] long for urological intervention. We will initiate ceftriaxone for now. Urine  cultures will be obtained. Apparently, last year when she required a stent she had significant infection involving her kidney.  #2 pregnancy: Based on history she is about [redacted] weeks pregnant. She hasn't had any prenatal checkups. Because of the presence of abdominal pain we will get obstetric ultrasound to rule out pregnancy related complications. Discussed with OB and it is okay to proceed with MRI and is safe to give ceftriaxone. She needs to followup with a OB/GYN, when she is discharged. This was emphasized to the patient. **SEE ADDENDUM BELOW**  #3 nausea and vomiting: is likely due to nephrolithiasis. Treat symptomatically. Give IV fluids.  #4 macular rash: This was very transient. She does not report any allergies to Dilaudid. Unclear what could have caused this rash. Monitor closely.  DVT Prophylaxis: SCDs Code Status: Full code Family Communication: Discussed with the patient and her boyfriend with the patient's permission  Disposition Plan: Transfer to [place] long for urological intervention.   Further management decisions will depend on results of further testing and patient's response to treatment.   [facility]  [facility] Pager [phone]  If 7PM-7AM, please contact night-coverage [email or web address] Password [email or web address]  02/21/2014, 1:49 PM  Disclaimer: This note was dictated with voice recognition software. Similar sounding words can inadvertently be transcribed and may not be corrected upon review.  US OB report was reviewed. There is a mention of small to moderate subchorionic hemorrhage with single intrauterine pregnancy. This was discussed with on call OB and apparently this does not uaually compromise pregnancy. At most patient will see some vaginal spotting. She will need repeat US as OP when she follows with her OB. Tried to notify patient but she is currently in MRI.  Joakim Huesman 3:14 PM

## 2014-02-21 NOTE — ED Provider Notes (Signed)
Complains of left flank pain gradual onset approximately 3 days ago. Pain became much worse last night. Pain feels colicky in nature severe. No treatment prior to coming here. Feels like kidney stone she's had in the past.  Orlie Dakin, MD 02/21/14 1247

## 2014-02-21 NOTE — ED Notes (Signed)
Patient remains in MRI; transferring care to [place], [place] [facility] [address]. Mother in room and is taking her belongings to new room assignment.

## 2014-02-21 NOTE — ED Notes (Signed)
Called to bring pain medication to MRI.

## 2014-02-22 DIAGNOSIS — E871 Hypo-osmolality and hyponatremia: Secondary | ICD-10-CM

## 2014-02-22 DIAGNOSIS — E876 Hypokalemia: Secondary | ICD-10-CM

## 2014-02-22 DIAGNOSIS — O468X1 Other antepartum hemorrhage, first trimester: Secondary | ICD-10-CM

## 2014-02-22 DIAGNOSIS — D649 Anemia, unspecified: Secondary | ICD-10-CM

## 2014-02-22 DIAGNOSIS — D72829 Elevated white blood cell count, unspecified: Secondary | ICD-10-CM

## 2014-02-22 DIAGNOSIS — O208 Other hemorrhage in early pregnancy: Secondary | ICD-10-CM

## 2014-02-22 DIAGNOSIS — O418X10 Other specified disorders of amniotic fluid and membranes, first trimester, not applicable or unspecified: Secondary | ICD-10-CM

## 2014-02-22 LAB — COMPREHENSIVE METABOLIC PANEL
ALT: 10 U/L (ref 0–35)
AST: 20 U/L (ref 0–37)
Albumin: 3.1 g/dL — ABNORMAL LOW (ref 3.5–5.2)
Alkaline Phosphatase: 44 U/L (ref 39–117)
Anion gap: 12 (ref 5–15)
BUN: 8 mg/dL (ref 6–23)
CO2: 22 mEq/L (ref 19–32)
Calcium: 9.3 mg/dL (ref 8.4–10.5)
Chloride: 101 mEq/L (ref 96–112)
Creatinine, Ser: 0.66 mg/dL (ref 0.50–1.10)
GFR calc non Af Amer: >90 mL/min (ref >90)
GLUCOSE: 90 mg/dL (ref 70–99)
POTASSIUM: 3.3 meq/L — AB (ref 3.7–5.3)
Sodium: 135 mEq/L — ABNORMAL LOW (ref 137–147)
Total Bilirubin: 0.4 mg/dL (ref 0.3–1.2)
Total Protein: 6.1 g/dL (ref 6.0–8.3)

## 2014-02-22 LAB — CBC
HEMATOCRIT: 31.8 % — AB (ref 36.0–46.0)
Hemoglobin: 11.1 g/dL — ABNORMAL LOW (ref 12.0–15.0)
MCH: 30.6 pg (ref 26.0–34.0)
MCHC: 34.9 g/dL (ref 30.0–36.0)
MCV: 87.6 fL (ref 78.0–100.0)
Platelets: 151 10*3/uL (ref 150–400)
RBC: 3.63 MIL/uL — ABNORMAL LOW (ref 3.87–5.11)
RDW: 12.9 % (ref 11.5–15.5)
WBC: 12.7 10*3/uL — ABNORMAL HIGH (ref 4.0–10.5)

## 2014-02-22 MED ORDER — HYDROMORPHONE HCL PF 1 MG/ML IJ SOLN: 1.0000 mg | INTRAMUSCULAR | Status: DC | PRN

## 2014-02-22 MED ORDER — POTASSIUM CHLORIDE CRYS ER 20 MEQ PO TBCR
40.0000 meq | EXTENDED_RELEASE_TABLET | Freq: Once | ORAL | Status: AC
  Administered 2014-02-22: 40 meq via ORAL
  Filled 2014-02-22: qty 2

## 2014-02-22 MED ORDER — ONDANSETRON HCL 4 MG PO TABS: 4.0000 mg | ORAL_TABLET | Freq: Three times a day (TID) | ORAL | Status: DC | PRN

## 2014-02-22 MED ORDER — OXYCODONE HCL 5 MG PO TABS: 5.0000 mg | ORAL_TABLET | ORAL | Status: DC | PRN

## 2014-02-22 MED ORDER — POLYETHYLENE GLYCOL 3350 17 G PO PACK: 17.0000 g | PACK | Freq: Every day | ORAL | Status: DC | PRN

## 2014-02-22 NOTE — Discharge Instructions (Signed)
Ureteral Colic Ureteral colic is spasm-like pain from the kidney or the ureter. This is often caused by a kidney stone. The pain is caused by the stone trying to get through the tubes that pass your pee. HOME CARE   Drink enough fluids to keep your pee (urine) clear or pale yellow.  Strain all your pee. A strainer will be provided. Keep anything caught in the strainer and bring it to your doctor. The stone causing the pain may be very small.  Only take medicine as told by your doctor.  Follow up with your doctor as told. GET HELP RIGHT AWAY IF:   Pain is not controlled with medicine.  Pain continues or gets worse.  The pain changes and there is chest or belly (abdominal) pain.  You pass out (faint).  You cannot pee.  You keep throwing up (vomiting).  You have a temperature by mouth above 102 F (38.9 C), not controlled by medicine. MAKE SURE YOU:   Understand these instructions.  Will watch this condition.  Will get help right away if you are not doing well or get worse. Document Released: 12/26/2007 Document Revised: 10/01/2011 Document Reviewed: 12/26/2007 ExitCare Patient Information 2015 [employer]. This information is not intended to replace advice given to you by your health care provider. Make sure you discuss any questions you have with your health care provider.  

## 2014-02-22 NOTE — Progress Notes (Signed)
Patient ID: Renee Sanchez, female   DOB: 1990/12/14, 23 y.o.   MRN: [id number]  Patient pain is improved.  She's had minimal pain overnight.  She is having no nausea vomiting.  No fevers or chills.  She has not seen a stone pass.  She felt like the Flexeril helped the pain as much as anything.  Filed Vitals:   02/22/14 0511  BP: 100/54  Pulse: 69  Temp: 98.2 F (36.8 C)  Resp: 20    PE: She is sitting in bed watching TV and eating breakfast, she is in no acute distress and appears comfortable.  CBC    Component Value Date/Time   WBC 12.7* 02/22/2014 0422   RBC 3.63* 02/22/2014 0422   HGB 11.1* 02/22/2014 0422   HCT 31.8* 02/22/2014 0422   PLT 151 02/22/2014 0422   MCV 87.6 02/22/2014 0422   MCH 30.6 02/22/2014 0422   MCHC 34.9 02/22/2014 0422   RDW 12.9 02/22/2014 0422   LYMPHSABS 0.4* 12/29/2013 2254   MONOABS 0.8 12/29/2013 2254   EOSABS 0.0 12/29/2013 2254   BASOSABS 0.0 12/29/2013 2254    BMET    Component Value Date/Time   NA 135* 02/22/2014 0422   K 3.3* 02/22/2014 0422   CL 101 02/22/2014 0422   CO2 22 02/22/2014 0422   GLUCOSE 90 02/22/2014 0422   BUN 8 02/22/2014 0422   CREATININE 0.66 02/22/2014 0422   CALCIUM 9.3 02/22/2014 0422   GFRNONAA >90 02/22/2014 0422   GFRAA >90 02/22/2014 0422    Impression-intrauterine pregnancy, left flank pain, left hydronephrosis-patient is improved.  She stable for discharge from a urologic point of view.  She had a small increase in her white count consistent with stone passage.  She could go home on pain medication and a few Flexeril if this is allowable during pregnancy.  As I told the patient and her mother is not uncommon to have multiple admissions for supportive care with IV fluids and pain medicines when trying to pass  a stone considering the alternatives are invasive procedures such as stent placement or nephrostomy tube.Therefore she was instructed if she develops any  Uncontrolled pain fevers chills nausea or vomiting she should return to the emergency  room.  I'll plan to see her back in 2-3 weeks with a renal ultrasound.

## 2014-02-22 NOTE — Discharge Summary (Addendum)
Physician Discharge Summary  Renee Sanchez YTK:[id number] DOB: [birth year] DOA: 02/21/2014  PCP: No primary provider on file.  Admit date: 02/21/2014 Discharge date: 02/22/2014  Recommendations for Outpatient Follow-up:  1. F/u with OB/GYN in 1-2 weeks for f/u of intrauterine pregnancy with small to moderate subchorionic hemorrhage.  She will need repeat US at outpatient.  Pls follow up pending urine culture report from 8/2.  Please repeat BMP to check potassium and sodium and CBC to f/u on WBC and anemia.   2. F/u with urology in 2-3 weeks for repeat renal US  Addendum:  Urine culture grew lactobacillus which may reflect contamination.  Recommend repeat urinalysis and culture by obstetrician or urology unless symptoms develop.  Discharge Diagnoses:  Principal Problem:   Nephrolithiasis Active Problems:   Left sided abdominal pain   Currently pregnant   Nausea and vomiting   Leukocytosis, unspecified   Subchorionic hemorrhage in first trimester   Hyponatremia   Hypokalemia   Normocytic anemia   Discharge Condition: Stable, improved  Diet recommendation:  Regular  Wt Readings from Last 3 Encounters:  02/21/14 58.196 kg (128 lb 4.8 oz)    History of present illness:   The patient is a 23 year old female with past medical history of kidney stone. She works as a Mudlogger at [employer]. She presented with 2 days of mild pain under her left rib cage which radiated towards her back and down. She also had some dysuria and polyuria. Her pain became progressively worse and she developed nausea and vomiting. Incidentally, she is [redacted] weeks pregnant. She denied any vaginal bleeding or discharge.  Hospital Course:   Nephrolithiasis. The patient presented with flank pain and abdominal pain with dysuria. Ultrasound of the kidneys revealed left-sided hydronephrosis. The stone was identified. The case was discussed with urology who recommended followup MRI.  Her MRI demonstrated  moderate left hydroureteral nephrosis with surrounding perinephric stranding of fluid. This suggested a recent obstructing ureteral calculus although no calculus could definitely be seen on MRI.  According to the urologist, 85% of pregnant patients will pass small kidney stone spontaneously. He suggested that if her pain continued or increased that they could consider cystoscopy with left ureteroscopy and lithotripsy, stone basket extraction and stent placement, however the alternative would be left nephrostomy tube placement. The patient stated that she would not want another stent placed and would prefer nephrostomy tube if needed. Fortunately, the patient passed her stone spontaneously during her hospitalization. She was advised to drink plenty of fluids and she was given a prescription for pain medication should she have recurrent pain.    Intrauterine pregnancy, confirmed with ultrasound. The placenta appears to be near the cervical loss. She had some mild to moderate subchorionic hemorrhage. This may cause some vaginal bleeding or spotting. She is advised to followup with OB/GYN within one to 2 weeks for further evaluation and repeat ultrasound. She was given prescription for prenatal vitamin. She was given information about pregnancy including foods to avoid.  Leukocytosis likely reactive from passing stone  Anemia likely secondary to hemodilution from IVF.  Will be repeated by OB at next visit.  Continue PNV.  Hyponatremia and mild hypokalemia due to nausea and vomiting.  Recommend regular diet and hydration.  Repeat BMP at [facility] appointment.    Procedures:  Renal ultrasound  Pelvic ultrasound  MRI abdomen and pelvis without contrast  Consultations:  Urology, Doctor Eskridge  Discharge Exam: Filed Vitals:   02/22/14 1347  BP: 91/44  Pulse: 81  Temp: 98 F (36.7 C)  Resp: 19   Filed Vitals:   02/21/14 1708 02/21/14 2040 02/22/14 0511 02/22/14 1347  BP: 120/63 118/66 100/54 91/44   Pulse:  80 69 81  Temp: 98.3 F (36.8 C) 98.2 F (36.8 C) 98.2 F (36.8 C) 98 F (36.7 C)  TempSrc:  Oral Oral Oral  Resp: 20 20 20 19   Height: 5\' 2"  (1.575 m)     Weight: 58.196 kg (128 lb 4.8 oz)     SpO2: 100% 99% 99% 100%    General: WF, NAD HEENT:  NCAT, MMM Cardiovascular: RRR, no mrg Respiratory: CTAB ABD:  NABS, soft, NT/ND MSK:  No LEE, normal tone and bulk  Discharge Instructions      Discharge Instructions   Call MD for:  difficulty breathing, headache or visual disturbances    Complete by:  As directed      Call MD for:  extreme fatigue    Complete by:  As directed      Call MD for:  hives    Complete by:  As directed      Call MD for:  persistant dizziness or light-headedness    Complete by:  As directed      Call MD for:  persistant nausea and vomiting    Complete by:  As directed      Call MD for:  severe uncontrolled pain    Complete by:  As directed      Call MD for:  temperature >100.4    Complete by:  As directed      Diet general    Complete by:  As directed      Discharge instructions    Complete by:  As directed   You were hospitalized with kidney stone which you passed.  Please stay hydrated!  I have given you prescriptions for zofran and pain medication to use if needed.  Please take miralax and colace when you get home to prevent constipation after all the narcotic medication you took in the hospital.  Keep taking miralax once or twice a day until you start having regular bowel movements again.  It is safe to continue using miralax throughout pregnancy.  Not all stool softeners and laxatives are safe in pregnancy, so please talk to your doctor.  Please avoid soft cheeses, certain fish, deli meats, alcohol and other drugs.  See the more comprehensive list in your discharge paperwork.  If you experience more than just a little spotting on your underwear or a little blood when you wipe, please call your obstetrician right away.  You will need to  follow up with your obstetrician within 1-2 weeks.     Increase activity slowly    Complete by:  As directed             Medication List         ondansetron 4 MG tablet  Commonly known as:  ZOFRAN  Take 1 tablet (4 mg total) by mouth every 8 (eight) hours as needed for nausea or vomiting.     oxyCODONE 5 MG immediate release tablet  Commonly known as:  Oxy IR/ROXICODONE  Take 1-2 tablets (5-10 mg total) by mouth every 4 (four) hours as needed for moderate pain.     polyethylene glycol packet  Commonly known as:  MIRALAX  Take 17 g by mouth daily as needed for mild constipation or moderate constipation.     prenatal multivitamin Tabs tablet  Take 1 tablet by  mouth daily at 12 noon.       Follow-up Information   Follow up with Festus Aloe, MD In 2 weeks.   Specialty:  Urology   Contact information:   [place] [address] [phone]       Follow up with Logan Bores, MD. Schedule an appointment as soon as possible for a visit in 1 week.   Specialty:  Obstetrics and Gynecology   Contact information:   [address] [place] [address] [phone]        The results of significant diagnostics from this hospitalization (including imaging, microbiology, ancillary and laboratory) are listed below for reference.    Significant Diagnostic Studies: Mr Pelvis Wo Contrast  02/21/2014   CLINICAL DATA:  Left flank pain, stone producer, pregnant  EXAM: MRI ABDOMEN AND PELVIS WITHOUT CONTRAST  TECHNIQUE: Multiplanar multisequence MR imaging of the abdomen and pelvis was performed. No intravenous contrast was administered.  COMPARISON:  None.  FINDINGS: MRI ABDOMEN FINDINGS  Liver, spleen, pancreas, and adrenal glands are within normal limits.  Gallbladder is unremarkable. No intrahepatic or extrahepatic ductal dilatation.  Right kidney is within normal limits. Moderate left hydronephrosis with surrounding perinephric stranding/fluid.  Although  the proximal left ureter is mildly dilated and the appearance suggests either a current or recent obstructing left ureteral calculus, no ureteral calculus is evident on MRI.  No suspicious abdominal lymphadenopathy.  No focal osseous lesions.  MRI PELVIS FINDINGS  Gravid uterus. Dedicated fetal anatomy not performed. Small subchronic hemorrhage.  Placenta is left lateral and posterior. Edge of the placenta is currently at the cervical os.  Bladder is within normal limits.  Trace right pelvic ascites.  No suspicious pelvic lymphadenopathy.  No focal osseous lesions.  IMPRESSION: Moderate left hydroureteronephrosis with surrounding perinephric stranding/fluid, suggesting a current or recent obstructing ureteral calculus, although no definite ureteral calculus is evident on MRI.  Gravid uterus. Edge of the placenta is currently at the cervical os.   Electronically Signed   By: Julian Hy M.D.   On: 02/21/2014 16:32   Mr Abdomen Wo Contrast  02/21/2014   CLINICAL DATA:  Left flank pain, stone producer, pregnant  EXAM: MRI ABDOMEN AND PELVIS WITHOUT CONTRAST  TECHNIQUE: Multiplanar multisequence MR imaging of the abdomen and pelvis was performed. No intravenous contrast was administered.  COMPARISON:  None.  FINDINGS: MRI ABDOMEN FINDINGS  Liver, spleen, pancreas, and adrenal glands are within normal limits.  Gallbladder is unremarkable. No intrahepatic or extrahepatic ductal dilatation.  Right kidney is within normal limits. Moderate left hydronephrosis with surrounding perinephric stranding/fluid.  Although the proximal left ureter is mildly dilated and the appearance suggests either a current or recent obstructing left ureteral calculus, no ureteral calculus is evident on MRI.  No suspicious abdominal lymphadenopathy.  No focal osseous lesions.  MRI PELVIS FINDINGS  Gravid uterus. Dedicated fetal anatomy not performed. Small subchronic hemorrhage.  Placenta is left lateral and posterior. Edge of the placenta  is currently at the cervical os.  Bladder is within normal limits.  Trace right pelvic ascites.  No suspicious pelvic lymphadenopathy.  No focal osseous lesions.  IMPRESSION: Moderate left hydroureteronephrosis with surrounding perinephric stranding/fluid, suggesting a current or recent obstructing ureteral calculus, although no definite ureteral calculus is evident on MRI.  Gravid uterus. Edge of the placenta is currently at the cervical os.   Electronically Signed   By: Julian Hy M.D.   On: 02/21/2014 16:32   US Ob Comp  Less 14 Wks  02/21/2014   CLINICAL DATA:  Pregnancy with Kidney stone.  EXAM: OBSTETRIC <14 WK ULTRASOUND  TECHNIQUE: Transabdominal ultrasound was performed for evaluation of the gestation as well as the maternal uterus and adnexal regions.  COMPARISON:  None.  FINDINGS: Intrauterine gestational sac: Visualized/normal in shape.  Yolk sac:  Not visible.  Embryo:  Present  Cardiac Activity: Present  Heart Rate: 131 bpm  CRL:   56.1  mm   12 w 1 d                  [place] EDC: 09/04/2014  Maternal uterus/adnexae: Small to moderate subchorionic hemorrhage present on image number 40.  Physiologic appearance of both ovaries.  No free fluid.  IMPRESSION: Single intrauterine pregnancy with small to moderate subchorionic hemorrhage.   Electronically Signed   By: Dereck Ligas M.D.   On: 02/21/2014 15:02   US Renal  02/21/2014   CLINICAL DATA:  Pain, kidney stone, pregnant  EXAM: RENAL/URINARY TRACT ULTRASOUND COMPLETE  COMPARISON:  None.  FINDINGS: Right Kidney:  Length: 11.6 cm.  No mass or hydronephrosis.  Left Kidney:  Length: 11.7 cm.  Moderate hydronephrosis.  Bladder:  Within normal limits. Right bladder jet is visualized. A left bladder jet is not demonstrated.  IMPRESSION: Moderate left hydronephrosis, etiology unclear by ultrasound.  A left bladder jet is not demonstrated.   Electronically Signed   By: Julian Hy M.D.   On: 02/21/2014 10:59    Microbiology: No results found for  this or any previous visit (from the past 240 hour(s)).   Labs: Basic Metabolic Panel:  Recent Labs Lab 02/21/14 0920 02/22/14 0422  NA 136* 135*  K 3.6* 3.3*  CL 100 101  CO2 21 22  GLUCOSE 89 90  BUN 9 8  CREATININE 0.60 0.66  CALCIUM 9.6 9.3   Liver Function Tests:  Recent Labs Lab 02/22/14 0422  AST 20  ALT 10  ALKPHOS 44  BILITOT 0.4  PROT 6.1  ALBUMIN 3.1*   No results found for this basename: LIPASE, AMYLASE,  in the last 168 hours No results found for this basename: AMMONIA,  in the last 168 hours CBC:  Recent Labs Lab 02/21/14 0920 02/22/14 0422  WBC 9.1 12.7*  HGB 12.5 11.1*  HCT 35.1* 31.8*  MCV 86.7 87.6  PLT 189 151   Cardiac Enzymes: No results found for this basename: CKTOTAL, CKMB, CKMBINDEX, TROPONINI,  in the last 168 hours BNP: BNP (last 3 results) No results found for this basename: PROBNP,  in the last 8760 hours CBG: No results found for this basename: GLUCAP,  in the last 168 hours  Time coordinating discharge: 35 minutes  Signed:  Holliday Sheaffer  [facility] 02/22/2014, 3:26 PM

## 2014-02-23 ENCOUNTER — Telehealth: Payer: Self-pay | Admitting: Internal Medicine

## 2014-02-23 LAB — URINE CULTURE

## 2014-02-23 NOTE — ED Provider Notes (Signed)
Medical screening examination/treatment/procedure(s) were conducted as a shared visit with non-physician practitioner(s) and myself.  I personally evaluated the patient during the encounter.   EKG Interpretation None       Orlie Dakin, MD 02/23/14 [phone]

## 2014-02-23 NOTE — Telephone Encounter (Signed)
Attempted to call regarding urine culture results.  She should receive antibiotics if she develops symptoms of urinary tract infection, otherwise, her OB should simply repeat her urine culture.

## 2014-03-18 LAB — OB RESULTS CONSOLE ANTIBODY SCREEN: ANTIBODY SCREEN: NEGATIVE

## 2014-03-18 LAB — OB RESULTS CONSOLE HEPATITIS B SURFACE ANTIGEN: Hepatitis B Surface Ag: NEGATIVE

## 2014-03-18 LAB — OB RESULTS CONSOLE RUBELLA ANTIBODY, IGM: Rubella: NON-IMMUNE/NOT IMMUNE

## 2014-03-18 LAB — OB RESULTS CONSOLE GC/CHLAMYDIA
Chlamydia: NEGATIVE
Gonorrhea: NEGATIVE

## 2014-03-18 LAB — OB RESULTS CONSOLE ABO/RH: RH TYPE: POSITIVE

## 2014-03-18 LAB — OB RESULTS CONSOLE RPR: RPR: NONREACTIVE

## 2014-03-25 ENCOUNTER — Other Ambulatory Visit: Payer: Self-pay | Admitting: Urology

## 2014-05-24 ENCOUNTER — Encounter (HOSPITAL_COMMUNITY): Payer: Self-pay | Admitting: Emergency Medicine

## 2014-07-23 NOTE — L&D Delivery Note (Signed)
Delivery Note She pushed for a little more than an hour, had significant right hip pain.  With vertex at +3, Kiwi vacuum applied to help expedite delivery due to hip pain.  Vacuum achieved just enough suction to help get vertex to +4, but vacuum would not maintain suction so it was removed.  She then pushed the vertex out on her own.  At 5:14 PM a viable female was delivered via Vaginal, Spontaneous Delivery (Presentation: Right Occiput Anterior).  APGAR: 9, 9; weight  pending.   Placenta status: Intact, Spontaneous.  Cord: 3 vessels with the following complications: None.  Anesthesia: Epidural  Episiotomy: None Lacerations: 2nd degree Suture Repair: 2.0 vicryl Est. Blood Loss (mL): 500  Mom to postpartum.  Baby to Couplet care / Skin to Skin.  Karsynn Deweese D 08/25/2014, 5:37 PM

## 2014-08-25 ENCOUNTER — Inpatient Hospital Stay (HOSPITAL_COMMUNITY): Payer: 59 | Admitting: Anesthesiology

## 2014-08-25 ENCOUNTER — Encounter (HOSPITAL_COMMUNITY): Payer: Self-pay

## 2014-08-25 ENCOUNTER — Inpatient Hospital Stay (HOSPITAL_COMMUNITY)
Admission: AD | Admit: 2014-08-25 | Discharge: 2014-08-27 | DRG: 775 | Disposition: A | Discharge status: Home / Self Care | Payer: 59 | Source: Ambulatory Visit | Attending: Obstetrics and Gynecology | Admitting: Obstetrics and Gynecology

## 2014-08-25 DIAGNOSIS — Z3A38 38 weeks gestation of pregnancy: Secondary | ICD-10-CM | POA: Diagnosis present

## 2014-08-25 DIAGNOSIS — Z3403 Encounter for supervision of normal first pregnancy, third trimester: Secondary | ICD-10-CM | POA: Diagnosis present

## 2014-08-25 DIAGNOSIS — Z3493 Encounter for supervision of normal pregnancy, unspecified, third trimester: Secondary | ICD-10-CM

## 2014-08-25 HISTORY — DX: Encounter for supervision of normal pregnancy, unspecified, third trimester: Z34.93

## 2014-08-25 LAB — ABO/RH: ABO/RH(D): A POS

## 2014-08-25 LAB — TYPE AND SCREEN
ABO/RH(D): A POS
ANTIBODY SCREEN: NEGATIVE

## 2014-08-25 LAB — CBC
HCT: 34.8 % — ABNORMAL LOW (ref 36.0–46.0)
Hemoglobin: 11.8 g/dL — ABNORMAL LOW (ref 12.0–15.0)
MCH: 30.8 pg (ref 26.0–34.0)
MCHC: 33.9 g/dL (ref 30.0–36.0)
MCV: 90.9 fL (ref 78.0–100.0)
PLATELETS: 194 10*3/uL (ref 150–400)
RBC: 3.83 MIL/uL — ABNORMAL LOW (ref 3.87–5.11)
RDW: 12.8 % (ref 11.5–15.5)
WBC: 13.6 10*3/uL — AB (ref 4.0–10.5)

## 2014-08-25 MED ORDER — DIBUCAINE 1 % RE OINT: 1.0000 "application " | TOPICAL_OINTMENT | RECTAL | Status: DC | PRN

## 2014-08-25 MED ORDER — LACTATED RINGERS IV SOLN
INTRAVENOUS | Status: DC
  Administered 2014-08-25: 14:00:00 via INTRAVENOUS

## 2014-08-25 MED ORDER — LACTATED RINGERS IV SOLN: 500.0000 mL | INTRAVENOUS | Status: DC | PRN

## 2014-08-25 MED ORDER — FLEET ENEMA 7-19 GM/118ML RE ENEM: 1.0000 | ENEMA | RECTAL | Status: DC | PRN

## 2014-08-25 MED ORDER — BENZOCAINE-MENTHOL 20-0.5 % EX AERO
1.0000 "application " | INHALATION_SPRAY | CUTANEOUS | Status: DC | PRN
  Administered 2014-08-25: 1 via TOPICAL
  Filled 2014-08-25: qty 56

## 2014-08-25 MED ORDER — ONDANSETRON HCL 4 MG PO TABS: 4.0000 mg | ORAL_TABLET | ORAL | Status: DC | PRN

## 2014-08-25 MED ORDER — SENNOSIDES-DOCUSATE SODIUM 8.6-50 MG PO TABS
2.0000 | ORAL_TABLET | ORAL | Status: DC
  Administered 2014-08-26: 2 via ORAL
  Filled 2014-08-25: qty 2

## 2014-08-25 MED ORDER — FENTANYL 2.5 MCG/ML BUPIVACAINE 1/10 % EPIDURAL INFUSION (WH - ANES)
INTRAMUSCULAR | Status: DC | PRN
  Administered 2014-08-25: 14 mL/h via EPIDURAL

## 2014-08-25 MED ORDER — FENTANYL 2.5 MCG/ML BUPIVACAINE 1/10 % EPIDURAL INFUSION (WH - ANES)
14.0000 mL/h | INTRAMUSCULAR | Status: DC | PRN
  Administered 2014-08-25: 14 mL/h via EPIDURAL
  Filled 2014-08-25 (×2): qty 125

## 2014-08-25 MED ORDER — PRENATAL MULTIVITAMIN CH
1.0000 | ORAL_TABLET | Freq: Every day | ORAL | Status: DC
  Administered 2014-08-26: 1 via ORAL
  Filled 2014-08-25: qty 1

## 2014-08-25 MED ORDER — OXYTOCIN 40 UNITS IN LACTATED RINGERS INFUSION - SIMPLE MED
62.5000 mL/h | INTRAVENOUS | Status: DC
  Administered 2014-08-25: 500 mL/h via INTRAVENOUS
  Filled 2014-08-25: qty 1000

## 2014-08-25 MED ORDER — IBUPROFEN 600 MG PO TABS
600.0000 mg | ORAL_TABLET | Freq: Four times a day (QID) | ORAL | Status: DC
  Administered 2014-08-25 – 2014-08-27 (×6): 600 mg via ORAL
  Filled 2014-08-25 (×6): qty 1

## 2014-08-25 MED ORDER — OXYTOCIN 40 UNITS IN LACTATED RINGERS INFUSION - SIMPLE MED: 62.5000 mL/h | INTRAVENOUS | Status: DC

## 2014-08-25 MED ORDER — MEASLES, MUMPS & RUBELLA VAC ~~LOC~~ INJ: 0.5000 mL | INJECTION | Freq: Once | SUBCUTANEOUS | Status: DC

## 2014-08-25 MED ORDER — OXYCODONE-ACETAMINOPHEN 5-325 MG PO TABS
1.0000 | ORAL_TABLET | ORAL | Status: DC | PRN
  Filled 2014-08-25 (×2): qty 1

## 2014-08-25 MED ORDER — LIDOCAINE HCL (PF) 1 % IJ SOLN
INTRAMUSCULAR | Status: DC | PRN
  Administered 2014-08-25 (×2): 4 mL

## 2014-08-25 MED ORDER — LANOLIN HYDROUS EX OINT: TOPICAL_OINTMENT | CUTANEOUS | Status: DC | PRN

## 2014-08-25 MED ORDER — TETANUS-DIPHTH-ACELL PERTUSSIS 5-2.5-18.5 LF-MCG/0.5 IM SUSP: 0.5000 mL | Freq: Once | INTRAMUSCULAR | Status: DC

## 2014-08-25 MED ORDER — LACTATED RINGERS IV SOLN: 500.0000 mL | Freq: Once | INTRAVENOUS | Status: DC

## 2014-08-25 MED ORDER — OXYCODONE-ACETAMINOPHEN 5-325 MG PO TABS: 2.0000 | ORAL_TABLET | ORAL | Status: DC | PRN

## 2014-08-25 MED ORDER — SIMETHICONE 80 MG PO CHEW: 80.0000 mg | CHEWABLE_TABLET | ORAL | Status: DC | PRN

## 2014-08-25 MED ORDER — ACETAMINOPHEN 325 MG PO TABS: 650.0000 mg | ORAL_TABLET | ORAL | Status: DC | PRN

## 2014-08-25 MED ORDER — METHYLERGONOVINE MALEATE 0.2 MG/ML IJ SOLN: 0.2000 mg | INTRAMUSCULAR | Status: DC | PRN

## 2014-08-25 MED ORDER — CITRIC ACID-SODIUM CITRATE 334-500 MG/5ML PO SOLN: 30.0000 mL | ORAL | Status: DC | PRN

## 2014-08-25 MED ORDER — ZOLPIDEM TARTRATE 5 MG PO TABS: 5.0000 mg | ORAL_TABLET | Freq: Every evening | ORAL | Status: DC | PRN

## 2014-08-25 MED ORDER — EPHEDRINE 5 MG/ML INJ
10.0000 mg | INTRAVENOUS | Status: DC | PRN
  Filled 2014-08-25: qty 2

## 2014-08-25 MED ORDER — OXYCODONE-ACETAMINOPHEN 5-325 MG PO TABS
2.0000 | ORAL_TABLET | ORAL | Status: DC | PRN
  Administered 2014-08-26: 2 via ORAL
  Filled 2014-08-25: qty 2

## 2014-08-25 MED ORDER — METHYLERGONOVINE MALEATE 0.2 MG PO TABS: 0.2000 mg | ORAL_TABLET | ORAL | Status: DC | PRN

## 2014-08-25 MED ORDER — OXYTOCIN BOLUS FROM INFUSION: 500.0000 mL | INTRAVENOUS | Status: DC

## 2014-08-25 MED ORDER — BUTORPHANOL TARTRATE 1 MG/ML IJ SOLN: 1.0000 mg | INTRAMUSCULAR | Status: DC | PRN

## 2014-08-25 MED ORDER — PHENYLEPHRINE 40 MCG/ML (10ML) SYRINGE FOR IV PUSH (FOR BLOOD PRESSURE SUPPORT)
80.0000 ug | PREFILLED_SYRINGE | INTRAVENOUS | Status: DC | PRN
  Filled 2014-08-25: qty 20
  Filled 2014-08-25: qty 2

## 2014-08-25 MED ORDER — ONDANSETRON HCL 4 MG/2ML IJ SOLN: 4.0000 mg | INTRAMUSCULAR | Status: DC | PRN

## 2014-08-25 MED ORDER — DIPHENHYDRAMINE HCL 25 MG PO CAPS: 25.0000 mg | ORAL_CAPSULE | Freq: Four times a day (QID) | ORAL | Status: DC | PRN

## 2014-08-25 MED ORDER — LIDOCAINE HCL (PF) 1 % IJ SOLN: 30.0000 mL | INTRAMUSCULAR | Status: DC | PRN

## 2014-08-25 MED ORDER — LIDOCAINE HCL (PF) 1 % IJ SOLN
30.0000 mL | INTRAMUSCULAR | Status: DC | PRN
  Filled 2014-08-25: qty 30

## 2014-08-25 MED ORDER — OXYCODONE-ACETAMINOPHEN 5-325 MG PO TABS
1.0000 | ORAL_TABLET | ORAL | Status: DC | PRN
  Administered 2014-08-26 – 2014-08-27 (×3): 1 via ORAL
  Filled 2014-08-25: qty 1

## 2014-08-25 MED ORDER — MAGNESIUM HYDROXIDE 400 MG/5ML PO SUSP: 30.0000 mL | ORAL | Status: DC | PRN

## 2014-08-25 MED ORDER — PHENYLEPHRINE 40 MCG/ML (10ML) SYRINGE FOR IV PUSH (FOR BLOOD PRESSURE SUPPORT)
80.0000 ug | PREFILLED_SYRINGE | INTRAVENOUS | Status: DC | PRN
  Filled 2014-08-25: qty 2

## 2014-08-25 MED ORDER — ONDANSETRON HCL 4 MG/2ML IJ SOLN: 4.0000 mg | Freq: Four times a day (QID) | INTRAMUSCULAR | Status: DC | PRN

## 2014-08-25 MED ORDER — BUTORPHANOL TARTRATE 1 MG/ML IJ SOLN: 1.0000 mg | Freq: Once | INTRAMUSCULAR | Status: DC

## 2014-08-25 MED ORDER — WITCH HAZEL-GLYCERIN EX PADS: 1.0000 "application " | MEDICATED_PAD | CUTANEOUS | Status: DC | PRN

## 2014-08-25 MED ORDER — LACTATED RINGERS IV SOLN
INTRAVENOUS | Status: DC
  Administered 2014-08-25: 1000 mL via INTRAVENOUS

## 2014-08-25 MED ORDER — DIPHENHYDRAMINE HCL 50 MG/ML IJ SOLN
12.5000 mg | INTRAMUSCULAR | Status: DC | PRN
  Administered 2014-08-25: 12.5 mg via INTRAVENOUS
  Filled 2014-08-25: qty 1

## 2014-08-25 MED ORDER — ONDANSETRON HCL 4 MG/2ML IJ SOLN
4.0000 mg | Freq: Four times a day (QID) | INTRAMUSCULAR | Status: DC | PRN
  Administered 2014-08-25: 4 mg via INTRAVENOUS
  Filled 2014-08-25: qty 2

## 2014-08-25 MED ORDER — OXYCODONE-ACETAMINOPHEN 5-325 MG PO TABS: 1.0000 | ORAL_TABLET | ORAL | Status: DC | PRN

## 2014-08-25 NOTE — Anesthesia Procedure Notes (Signed)
Epidural Patient location during procedure: OB Start time: 08/25/2014 8:01 AM  Staffing Anesthesiologist: Seng Fouts A.  Preanesthetic Checklist Completed: patient identified, site marked, surgical consent, pre-op evaluation, timeout performed, IV checked, risks and benefits discussed and monitors and equipment checked  Epidural Patient position: sitting Prep: site prepped and draped and DuraPrep Patient monitoring: continuous pulse ox and blood pressure Approach: midline Location: L4-L5 Injection technique: LOR air  Needle:  Needle type: Tuohy  Needle gauge: 17 G Needle length: 9 cm and 9 Needle insertion depth: 4 cm Catheter type: closed end flexible Catheter size: 19 Gauge Catheter at skin depth: 9 cm Test dose: negative and Other  Assessment Events: blood not aspirated, injection not painful, no injection resistance, negative IV test and no paresthesia  Additional Notes Patient identified. Risks and benefits discussed including failed block, incomplete  Pain control, post dural puncture headache, nerve damage, paralysis, blood pressure Changes, nausea, vomiting, reactions to medications-both toxic and allergic and post Partum back pain. All questions were answered. Patient expressed understanding and wished to proceed. Sterile technique was used throughout procedure. Epidural site was Dressed with sterile barrier dressing. No paresthesias, signs of intravascular injection Or signs of intrathecal spread were encountered.  Patient was more comfortable after the epidural was dosed. Please see RN's note for documentation of vital signs and FHR which are stable.

## 2014-08-25 NOTE — H&P (Signed)
Renee Sanchez is a 24 y.o. female  G1P0 at 19+ with contractions, cervical change in MAU.  Admit, augment prn.  Somewhat late entry to care at [facility].  Relatively uncomplicated prenatal care.  gbbs neg.     Maternal Medical History:  Reason for admission: Contractions.   Contractions: Onset was 6-12 hours ago.   Frequency: regular.   Perceived severity is moderate.    Fetal activity: Perceived fetal activity is normal.    Prenatal Complications - Diabetes: none.    OB History    Gravida Para Term Preterm AB TAB SAB Ectopic Multiple Living   1             G1 present No abn pap + CHL   Past Medical History  Diagnosis Date  . Kidney stone   . Normal intrauterine pregnancy in third trimester 08/25/2014   Past Surgical History  Procedure Laterality Date  . Kidney stent     Family History: family history includes Breast cancer in her other.DM, uterine CA, tongue CA Social History:  reports that she has quit smoking. She has never used smokeless tobacco. She reports that she does not drink alcohol or use illicit drugs. nurse tech meds PNV All NKDA   Prenatal Transfer Tool  Maternal Diabetes: No Genetic Screening: Normal Maternal Ultrasounds/Referrals: Normal Fetal Ultrasounds or other Referrals:  None Maternal Substance Abuse:  No Significant Maternal Medications:  None Significant Maternal Lab Results:  Lab values include: Group B Strep negative Other Comments:  None  Review of Systems  Constitutional: Negative.   HENT: Negative.   Eyes: Negative.   Respiratory: Negative.   Cardiovascular: Negative.   Gastrointestinal: Negative.   Genitourinary: Negative.   Musculoskeletal: Negative.   Skin: Negative.   Neurological: Negative.   Psychiatric/Behavioral: Negative.     Dilation: 4.5 Effacement (%): 80 Station: -2 Exam by:: Renee Weston,RN Blood pressure 128/74, pulse 65, temperature 97.9 F (36.6 C), temperature source Oral, resp. rate 16, height 5\' 2"  (1.575 m),  weight 68.947 kg (152 lb), last menstrual period 12/01/2013. Maternal Exam:  Uterine Assessment: Contraction strength is moderate.  Contraction frequency is regular.   Abdomen: Patient reports no abdominal tenderness. Fundal height is appropriate for gestation.   Fetal presentation: vertex  Introitus: Normal vulva. Normal vagina.  Pelvis: adequate for delivery.   Cervix: Cervix evaluated by digital exam.     Physical Exam  Constitutional: She is oriented to person, place, and time. She appears well-developed and well-nourished.  HENT:  Head: Normocephalic and atraumatic.  Cardiovascular: Normal rate and regular rhythm.   Respiratory: Effort normal and breath sounds normal. No respiratory distress. She has no wheezes.  GI: Soft. Bowel sounds are normal. She exhibits no distension. There is no tenderness.  Musculoskeletal: Normal range of motion.  Neurological: She is alert and oriented to person, place, and time.  Skin: Skin is warm and dry.  Psychiatric: She has a normal mood and affect. Her behavior is normal.    Prenatal labs: ABO, Rh: A/Positive/-- (08/27 0000) Antibody: Negative (08/27 0000) Rubella: Nonimmune (08/27 0000) RPR: Nonreactive (08/27 0000)  HBsAg: Negative (08/27 0000)  HIV: NONREACTIVE (06/09 2254)  GBS:   negative  Hgb 12.2/Plt 203K/Ur Cx neg/ GC neg/ Chl neg/ CF neg/ AFP WNL/glucola 85  Tdap 06/09/14  [place] EDC 09/07/14 - nl anat, fundal plac, female  Assessment/Plan: 24yo G1P0 at 38+ in labor gbbs neg Augment prn Epidural/stadol for prn Expect SVD  Renee Sanchez, Renee Sanchez 08/25/2014, 7:04 AM

## 2014-08-25 NOTE — Progress Notes (Signed)
Getting more comfortable with epidural Afeb, VSS FHT- Cat I VE-5/80/-1, vtx, AROM clear Monitor progress, anticipate SVD

## 2014-08-25 NOTE — Anesthesia Preprocedure Evaluation (Signed)
Anesthesia Evaluation  Patient identified by MRN, date of birth, ID band Patient awake    Reviewed: Allergy & Precautions, NPO status , Patient's Chart, lab work & pertinent test results  Airway Mallampati: II  TM Distance: >3 FB Neck ROM: Full    Dental no notable dental hx. (+) Teeth Intact   Pulmonary former smoker,  breath sounds clear to auscultation  Pulmonary exam normal       Cardiovascular negative cardio ROS  Rhythm:Regular Rate:Normal     Neuro/Psych negative neurological ROS  negative psych ROS   GI/Hepatic Neg liver ROS, GERD-  Medicated and Controlled,  Endo/Other  negative endocrine ROS  Renal/GU Renal diseaseHx/o renal calculi   negative genitourinary   Musculoskeletal negative musculoskeletal ROS (+)   Abdominal   Peds  Hematology  (+) anemia ,   Anesthesia Other Findings   Reproductive/Obstetrics (+) Pregnancy                             Anesthesia Physical Anesthesia Plan  ASA: II  Anesthesia Plan: Epidural   Post-op Pain Management:    Induction:   Airway Management Planned: Natural Airway  Additional Equipment:   Intra-op Plan:   Post-operative Plan:   Informed Consent: I have reviewed the patients History and Physical, chart, labs and discussed the procedure including the risks, benefits and alternatives for the proposed anesthesia with the patient or authorized representative who has indicated his/her understanding and acceptance.     Plan Discussed with: Anesthesiologist  Anesthesia Plan Comments:         Anesthesia Quick Evaluation

## 2014-08-25 NOTE — MAU Note (Signed)
Was 2-3 cm yesterday in office.  Ctxs since 8 pm. Now they are 3-5 min apart.  No leaking. No bleeding. Baby moving well.

## 2014-08-25 NOTE — Progress Notes (Signed)
Comfortable with epidural Afeb, VSS FHT- Cat I VE-9/90/0, vtx Monitor progress, anticipate SVD

## 2014-08-26 ENCOUNTER — Encounter (HOSPITAL_COMMUNITY): Payer: Self-pay | Admitting: *Deleted

## 2014-08-26 LAB — RPR: RPR: NONREACTIVE

## 2014-08-26 LAB — CBC
HCT: 27.5 % — ABNORMAL LOW (ref 36.0–46.0)
Hemoglobin: 9.6 g/dL — ABNORMAL LOW (ref 12.0–15.0)
MCH: 31.9 pg (ref 26.0–34.0)
MCHC: 34.9 g/dL (ref 30.0–36.0)
MCV: 91.4 fL (ref 78.0–100.0)
PLATELETS: 163 10*3/uL (ref 150–400)
RBC: 3.01 MIL/uL — AB (ref 3.87–5.11)
RDW: 13 % (ref 11.5–15.5)
WBC: 12.1 10*3/uL — ABNORMAL HIGH (ref 4.0–10.5)

## 2014-08-26 NOTE — Anesthesia Postprocedure Evaluation (Signed)
Anesthesia Post Note  Patient: Renee Sanchez  Procedure(s) Performed: * No procedures listed *  Anesthesia type: Epidural  Patient location: Mother/Baby  Post pain: Pain level controlled  Post assessment: Post-op Vital signs reviewed  Last Vitals:  Filed Vitals:   08/26/14 0019  BP: 105/49  Pulse: 80  Temp: 36.7 C  Resp: 18    Post vital signs: Reviewed  Level of consciousness:alert  Complications: No apparent anesthesia complications

## 2014-08-26 NOTE — Lactation Note (Signed)
This note was copied from the chart of Renee Reagan Behlke. Lactation Consultation Note  Patient Name: Renee Sanchez [id number] Date: 08/26/2014 Reason for consult: Initial assessment  Visited with Renee Sanchez and Renee Sanchez, initial visit at 45 hrs old.  Renee Sanchez complaining of some soreness of nipples.  Some slight pinkness at nipple tip.  Demonstrated manual breast expression, and return demo done.  Reviewed basics of positioning and latch.  Baby able to latch with a deep areolar grasp.  Latch score - 9.  Comfort Gels given with instructions on use and care.  Encouraged colostrum on nipple. Brochure left in room, encouraged skin to skin, and cue based feedings.  To ask for help with latching as needed.  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     Consult Status Consult Status: Follow-up Date: 08/27/14 Follow-up type: [place] 08/26/2014, 1:22 PM

## 2014-08-26 NOTE — Progress Notes (Signed)
PPD #1 No problems Afeb, VSS Fundus firm, NT at U-1 Hgb 11.8 to 9.6 Continue routine postpartum care

## 2014-08-27 MED ORDER — IBUPROFEN 600 MG PO TABS: 600.0000 mg | ORAL_TABLET | Freq: Four times a day (QID) | ORAL | Status: DC

## 2014-08-27 MED ORDER — OXYCODONE-ACETAMINOPHEN 5-325 MG PO TABS: 1.0000 | ORAL_TABLET | ORAL | Status: DC | PRN

## 2014-08-27 NOTE — Discharge Summary (Signed)
Obstetric Discharge Summary Reason for Admission: onset of labor Prenatal Procedures: none Intrapartum Procedures: spontaneous vaginal delivery Postpartum Procedures: none Complications-Operative and Postpartum: 2nd degree perineal laceration HEMOGLOBIN  Date Value Ref Range Status  08/26/2014 9.6* 12.0 - 15.0 g/dL Final    Comment:    DELTA CHECK NOTED REPEATED TO VERIFY    HCT  Date Value Ref Range Status  08/26/2014 27.5* 36.0 - 46.0 % Final    Physical Exam:  General: alert Lochia: appropriate Uterine Fundus: firm   Discharge Diagnoses: Term Pregnancy-delivered  Discharge Information: Date: 08/27/2014 Activity: pelvic rest Diet: routine Medications: Ibuprofen and Percocet Condition: stable Instructions: refer to practice specific booklet Discharge to: home Follow-up Information    Follow up with Ceili Boshers D, MD. Schedule an appointment as soon as possible for a visit in 6 weeks.   Specialty:  Obstetrics and Gynecology   Contact information:   [address], [place] [address] [phone]       Newborn Data: Live born female  Birth Weight: 6 lb 15.8 oz (3170 g) APGAR: 9, 9  Home with mother.  Markitta Ausburn D 08/27/2014, 8:47 AM

## 2014-08-27 NOTE — Lactation Note (Signed)
This note was copied from the chart of [name]. Lactation Consultation Note: Follow up visit with mom before DC. She reports that baby has been nursing well. Nipples are a little sore- slightly pink and raw on tip of nipple. Using comfort gels and mom reports they are helping. Reports nipples are no worse than yesterday. Feels she is doing better getting the baby deeper on the breast. No questions at present. To call prn  Patient Name: [name] [id number] Date: 08/27/2014 Reason for consult: Follow-up assessment   Maternal Data Formula Feeding for Exclusion: No Has patient been taught Hand Expression?: Yes Does the patient have breastfeeding experience prior to this delivery?: No  Feeding    LATCH Score/Interventions          Comfort (Breast/Nipple): Filling, red/small blisters or bruises, mild/mod discomfort  Problem noted: Mild/Moderate discomfort Interventions (Mild/moderate discomfort): Comfort gels;Hand expression        Lactation Tools Discussed/Used     Consult Status Consult Status: Complete    Truddie Crumble 08/27/2014, 9:57 AM

## 2014-08-27 NOTE — Discharge Instructions (Signed)
As per discharge pamphlet °

## 2014-08-27 NOTE — Progress Notes (Signed)
PPD #2 Doing well Afeb, VSS Fundus firm D/c home 

## 2014-08-31 ENCOUNTER — Encounter (HOSPITAL_COMMUNITY): Payer: Self-pay

## 2015-08-17 DIAGNOSIS — F988 Other specified behavioral and emotional disorders with onset usually occurring in childhood and adolescence: Secondary | ICD-10-CM | POA: Diagnosis not present

## 2015-08-17 DIAGNOSIS — Z13220 Encounter for screening for lipoid disorders: Secondary | ICD-10-CM | POA: Diagnosis not present

## 2015-08-17 DIAGNOSIS — R5383 Other fatigue: Secondary | ICD-10-CM | POA: Diagnosis not present

## 2015-08-17 DIAGNOSIS — D229 Melanocytic nevi, unspecified: Secondary | ICD-10-CM | POA: Diagnosis not present

## 2015-08-17 MED FILL — VYVANSE 40 MG CAPSULE: 40 | 30 days supply | Qty: 30 | Fill #0

## 2015-09-08 DIAGNOSIS — D225 Melanocytic nevi of trunk: Secondary | ICD-10-CM | POA: Diagnosis not present

## 2015-09-08 DIAGNOSIS — D485 Neoplasm of uncertain behavior of skin: Secondary | ICD-10-CM | POA: Diagnosis not present

## 2015-10-11 MED FILL — AMPHETAMINE SALTS 15 MG TAB: 15 | 30 days supply | Qty: 60 | Fill #0

## 2016-02-03 MED FILL — diazePAM 10 MG TABS: 10 | 1 days supply | Qty: 1 | Fill #0

## 2016-02-06 MED FILL — AMPHETAMINE SALTS 15 MG TAB: 15 | 30 days supply | Qty: 60 | Fill #0

## 2016-02-14 DIAGNOSIS — F330 Major depressive disorder, recurrent, mild: Secondary | ICD-10-CM | POA: Diagnosis not present

## 2016-02-14 DIAGNOSIS — F900 Attention-deficit hyperactivity disorder, predominantly inattentive type: Secondary | ICD-10-CM | POA: Diagnosis not present

## 2016-02-14 MED FILL — BUPROPION HCL XL 150 MG TAB: 150 | 30 days supply | Qty: 30 | Fill #0

## 2016-03-16 MED FILL — ALPRAZolam 0.25 MG TABS: 0.25 | 4 days supply | Qty: 10 | Fill #0

## 2016-03-16 MED FILL — BUPROPION HCL XL 300 MG TAB: 300 | 30 days supply | Qty: 30 | Fill #0

## 2016-05-09 MED FILL — BUPROPION HCL XL 300 MG TAB: 300 | 30 days supply | Qty: 30 | Fill #1

## 2016-05-09 MED FILL — AMPHETAMINE SALTS 15 MG TAB: 15 | 30 days supply | Qty: 60 | Fill #0

## 2016-06-20 DIAGNOSIS — R6889 Other general symptoms and signs: Secondary | ICD-10-CM | POA: Diagnosis not present

## 2016-06-20 DIAGNOSIS — J029 Acute pharyngitis, unspecified: Secondary | ICD-10-CM | POA: Diagnosis not present

## 2016-06-20 MED FILL — AMOX-CLAV 875-125 MG TABLET: 875-125 | 10 days supply | Qty: 20 | Fill #0

## 2016-08-01 DIAGNOSIS — J209 Acute bronchitis, unspecified: Secondary | ICD-10-CM | POA: Diagnosis not present

## 2016-08-01 MED FILL — FLUTICASONE PROP 50 MCG SPR: 50 | 30 days supply | Qty: 16 | Fill #0

## 2016-08-13 DIAGNOSIS — N912 Amenorrhea, unspecified: Secondary | ICD-10-CM | POA: Diagnosis not present

## 2016-08-13 DIAGNOSIS — R509 Fever, unspecified: Secondary | ICD-10-CM | POA: Diagnosis not present

## 2016-08-15 MED FILL — AZITHROMYCIN 250 MG TABLET: 250 | 5 days supply | Qty: 6 | Fill #0

## 2016-08-17 DIAGNOSIS — Z0000 Encounter for general adult medical examination without abnormal findings: Secondary | ICD-10-CM | POA: Diagnosis not present

## 2016-08-29 DIAGNOSIS — Z3201 Encounter for pregnancy test, result positive: Secondary | ICD-10-CM | POA: Diagnosis not present

## 2016-08-29 DIAGNOSIS — N911 Secondary amenorrhea: Secondary | ICD-10-CM | POA: Diagnosis not present

## 2016-08-29 DIAGNOSIS — O200 Threatened abortion: Secondary | ICD-10-CM | POA: Diagnosis not present

## 2016-09-01 ENCOUNTER — Inpatient Hospital Stay (HOSPITAL_COMMUNITY): Payer: 59

## 2016-09-01 ENCOUNTER — Encounter (HOSPITAL_COMMUNITY): Payer: Self-pay | Admitting: *Deleted

## 2016-09-01 ENCOUNTER — Inpatient Hospital Stay (HOSPITAL_COMMUNITY)
Admission: AD | Admit: 2016-09-01 | Discharge: 2016-09-01 | Disposition: A | Discharge status: Home / Self Care | Payer: 59 | Source: Ambulatory Visit | Attending: Obstetrics and Gynecology | Admitting: Obstetrics and Gynecology

## 2016-09-01 DIAGNOSIS — Z79899 Other long term (current) drug therapy: Secondary | ICD-10-CM | POA: Insufficient documentation

## 2016-09-01 DIAGNOSIS — Z3A01 Less than 8 weeks gestation of pregnancy: Secondary | ICD-10-CM

## 2016-09-01 DIAGNOSIS — Z87891 Personal history of nicotine dependence: Secondary | ICD-10-CM | POA: Diagnosis not present

## 2016-09-01 DIAGNOSIS — Z3491 Encounter for supervision of normal pregnancy, unspecified, first trimester: Secondary | ICD-10-CM

## 2016-09-01 DIAGNOSIS — R109 Unspecified abdominal pain: Secondary | ICD-10-CM | POA: Diagnosis not present

## 2016-09-01 DIAGNOSIS — O209 Hemorrhage in early pregnancy, unspecified: Secondary | ICD-10-CM | POA: Diagnosis not present

## 2016-09-01 DIAGNOSIS — O4690 Antepartum hemorrhage, unspecified, unspecified trimester: Secondary | ICD-10-CM

## 2016-09-01 LAB — WET PREP, GENITAL
Clue Cells Wet Prep HPF POC: NONE SEEN
SPERM: NONE SEEN
Trich, Wet Prep: NONE SEEN
Yeast Wet Prep HPF POC: NONE SEEN

## 2016-09-01 LAB — URINALYSIS, ROUTINE W REFLEX MICROSCOPIC
BILIRUBIN URINE: NEGATIVE
Bacteria, UA: NONE SEEN
Glucose, UA: NEGATIVE mg/dL
Ketones, ur: 5 mg/dL — AB
Nitrite: NEGATIVE
PH: 5 (ref 5.0–8.0)
Protein, ur: 30 mg/dL — AB
SPECIFIC GRAVITY, URINE: 1.026 (ref 1.005–1.030)

## 2016-09-01 LAB — HCG, QUANTITATIVE, PREGNANCY: HCG, BETA CHAIN, QUANT, S: 19754 m[IU]/mL — AB (ref <5)

## 2016-09-01 LAB — CBC
HCT: 34.9 % — ABNORMAL LOW (ref 36.0–46.0)
Hemoglobin: 12 g/dL (ref 12.0–15.0)
MCH: 29 pg (ref 26.0–34.0)
MCHC: 34.4 g/dL (ref 30.0–36.0)
MCV: 84.3 fL (ref 78.0–100.0)
Platelets: 218 10*3/uL (ref 150–400)
RBC: 4.14 MIL/uL (ref 3.87–5.11)
RDW: 13.9 % (ref 11.5–15.5)
WBC: 9.4 10*3/uL (ref 4.0–10.5)

## 2016-09-01 LAB — POCT PREGNANCY, URINE: Preg Test, Ur: POSITIVE — AB

## 2016-09-01 NOTE — Discharge Instructions (Signed)
Vaginal Bleeding During Pregnancy, First Trimester A small amount of bleeding (spotting) from the vagina is relatively common in early pregnancy. It usually stops on its own. Various things may cause bleeding or spotting in early pregnancy. Some bleeding may be related to the pregnancy, and some may not. In most cases, the bleeding is normal and is not a problem. However, bleeding can also be a sign of something serious. Be sure to tell your health care provider about any vaginal bleeding right away. Some possible causes of vaginal bleeding during the first trimester include:  Infection or inflammation of the cervix.  Growths (polyps) on the cervix.  Miscarriage or threatened miscarriage.  Pregnancy tissue has developed outside of the uterus and in a fallopian tube (tubal pregnancy).  Tiny cysts have developed in the uterus instead of pregnancy tissue (molar pregnancy). Follow these instructions at home: Watch your condition for any changes. The following actions may help to lessen any discomfort you are feeling:  Follow your health care provider's instructions for limiting your activity. If your health care provider orders bed rest, you may need to stay in bed and only get up to use the bathroom. However, your health care provider may allow you to continue light activity.  If needed, make plans for someone to help with your regular activities and responsibilities while you are on bed rest.  Keep track of the number of pads you use each day, how often you change pads, and how soaked (saturated) they are. Write this down.  Do not use tampons. Do not douche.  Do not have sexual intercourse or orgasms until approved by your health care provider.  If you pass any tissue from your vagina, save the tissue so you can show it to your health care provider.  Only take over-the-counter or prescription medicines as directed by your health care provider.  Do not take aspirin because it can make you  bleed.  Keep all follow-up appointments as directed by your health care provider. Contact a health care provider if:  You have any vaginal bleeding during any part of your pregnancy.  You have cramps or labor pains.  You have a fever, not controlled by medicine. Get help right away if:  You have severe cramps in your back or belly (abdomen).  You pass large clots or tissue from your vagina.  Your bleeding increases.  You feel light-headed or weak, or you have fainting episodes.  You have chills.  You are leaking fluid or have a gush of fluid from your vagina.  You pass out while having a bowel movement. This information is not intended to replace advice given to you by your health care provider. Make sure you discuss any questions you have with your health care provider. Document Released: 04/18/2005 Document Revised: 12/15/2015 Document Reviewed: 03/16/2013 Elsevier Interactive Patient Education  2017 [employer].  Pelvic Rest Introduction Pelvic rest may be recommended if:  Your placenta is partially or completely covering the opening of your cervix (placenta previa).  There is bleeding between the wall of the uterus and the amniotic sac in the first trimester of pregnancy (subchorionic hemorrhage).  You went into labor too early (preterm labor). Based on your overall health and the health of your baby, your health care provider will decide if pelvic rest is right for you. How do I rest my pelvis? For as long as told by your health care provider:  Do not have sex, sexual stimulation, or an orgasm.  Do not  use tampons. Do not douche. Do not put anything in your vagina.  Do not lift anything that is heavier than 10 lb (4.5 kg).  Avoid activities that take a lot of effort (are strenuous).  Avoid any activity in which your pelvic muscles could become strained. When should I seek medical care? Seek medical care if you have:  Cramping pain in your lower  abdomen.  Vaginal discharge.  A low, dull backache.  Regular contractions.  Uterine tightening. When should I seek immediate medical care? Seek immediate medical care if:  You have vaginal bleeding and you are pregnant. This information is not intended to replace advice given to you by your health care provider. Make sure you discuss any questions you have with your health care provider. Document Released: 11/03/2010 Document Revised: 12/15/2015 Document Reviewed: 01/10/2015  2017 Elsevier

## 2016-09-01 NOTE — MAU Note (Signed)
Spotting for couple wks. On and off brown to pink spotting and then red tonight with small clots. Occ cramping.

## 2016-09-01 NOTE — MAU Provider Note (Signed)
History     CSN: [id number]  Arrival date and time: 09/01/16 2133   First Provider Initiated Contact with Patient 09/01/16 2212      Chief Complaint  Patient presents with  . Vaginal Bleeding   HPI  Renee Sanchez is a 26 y.o. G2P1001 at [redacted]w[redacted]d who presents with vaginal bleeding. Symptoms began at 7 pm. Reports large amount of blood with several small clots. Has not saturated pads. Bleeding has decreased since then. Lower abdominal cramping started around 6 pm. Reports pain that comes & goes. Rates 3/10. Took tylenol.  Denies n/v/d, constipation, dysuria, or recent intercourse.   OB History    Gravida Para Term Preterm AB Living   2 1 1     1    SAB TAB Ectopic Multiple Live Births         0 1      Past Medical History:  Diagnosis Date  . Kidney stone   . Normal intrauterine pregnancy in third trimester 08/25/2014    Past Surgical History:  Procedure Laterality Date  . kidney stent      Family History  Problem Relation Age of Onset  . Breast cancer Other     Social History  Substance Use Topics  . Smoking status: Former Research scientist (life sciences)  . Smokeless tobacco: Never Used  . Alcohol use No    Allergies: No Known Allergies  Prescriptions Prior to Admission  Medication Sig Dispense Refill Last Dose  . acetaminophen (TYLENOL) 325 MG tablet Take 650 mg by mouth every 6 (six) hours as needed.   09/01/2016 at Unknown time  . Prenatal Vit-Fe Fumarate-FA (PRENATAL MULTIVITAMIN) TABS tablet Take 1 tablet by mouth daily at 12 noon.   09/01/2016 at Unknown time  . ibuprofen (ADVIL,MOTRIN) 600 MG tablet Take 1 tablet (600 mg total) by mouth every 6 (six) hours. 30 tablet 0   . oxyCODONE-acetaminophen (PERCOCET/ROXICET) 5-325 MG per tablet Take 1-2 tablets by mouth every 4 (four) hours as needed (for pain scale greater than or equal to 4 and less than 7). 20 tablet 0   . polyethylene glycol (MIRALAX) packet Take 17 g by mouth daily as needed for mild constipation or moderate constipation. 100  each 0     Review of Systems  Constitutional: Negative.   Gastrointestinal: Positive for abdominal pain. Negative for constipation, diarrhea, nausea and vomiting.  Genitourinary: Positive for vaginal bleeding. Negative for dysuria and vaginal discharge.   Physical Exam   Blood pressure 124/64, pulse 82, temperature 98.6 F (37 C), resp. rate 18, height 5\' 3"  (1.6 m), weight 142 lb 9.6 oz (64.7 kg), last menstrual period 07/19/2016, unknown if currently breastfeeding.  Physical Exam  Nursing note and vitals reviewed. Constitutional: She is oriented to person, place, and time. She appears well-developed and well-nourished. No distress.  HENT:  Head: Normocephalic and atraumatic.  Eyes: Conjunctivae are normal. Right eye exhibits no discharge. Left eye exhibits no discharge. No scleral icterus.  Neck: Normal range of motion.  Cardiovascular: Normal rate, regular rhythm and normal heart sounds.   No murmur heard. Respiratory: Effort normal and breath sounds normal. No respiratory distress. She has no wheezes.  GI: Soft. Bowel sounds are normal. She exhibits no distension. There is no tenderness. There is no rebound and no guarding.  Genitourinary: Uterus normal. Cervix exhibits no motion tenderness and no friability. Right adnexum displays no mass and no tenderness. Left adnexum displays no mass and no tenderness. There is bleeding (small amount of dark red blood)  in the vagina.  Genitourinary Comments: Cervix closed  Neurological: She is alert and oriented to person, place, and time.  Skin: Skin is warm and dry. She is not diaphoretic.  Psychiatric: She has a normal mood and affect. Her behavior is normal. Judgment and thought content normal.    MAU Course  Procedures Results for orders placed or performed during the hospital encounter of 09/01/16 (from the past 24 hour(s))  hCG, quantitative, pregnancy     Status: Abnormal   Collection Time: 09/01/16  9:42 PM  Result Value Ref Range    hCG, Beta Chain, Quant, S 19,754 (H) <5 mIU/mL  CBC     Status: Abnormal   Collection Time: 09/01/16  9:42 PM  Result Value Ref Range   WBC 9.4 4.0 - 10.5 K/uL   RBC 4.14 3.87 - 5.11 MIL/uL   Hemoglobin 12.0 12.0 - 15.0 g/dL   HCT 34.9 (L) 36.0 - 46.0 %   MCV 84.3 78.0 - 100.0 fL   MCH 29.0 26.0 - 34.0 pg   MCHC 34.4 30.0 - 36.0 g/dL   RDW 13.9 11.5 - 15.5 %   Platelets 218 150 - 400 K/uL  Urinalysis, Routine w reflex microscopic (not at [facility])     Status: Abnormal   Collection Time: 09/01/16 10:00 PM  Result Value Ref Range   Color, Urine YELLOW YELLOW   APPearance HAZY (A) CLEAR   Specific Gravity, Urine 1.026 1.005 - 1.030   pH 5.0 5.0 - 8.0   Glucose, UA NEGATIVE NEGATIVE mg/dL   Hgb urine dipstick LARGE (A) NEGATIVE   Bilirubin Urine NEGATIVE NEGATIVE   Ketones, ur 5 (A) NEGATIVE mg/dL   Protein, ur 30 (A) NEGATIVE mg/dL   Nitrite NEGATIVE NEGATIVE   Leukocytes, UA TRACE (A) NEGATIVE   RBC / HPF TOO NUMEROUS TO COUNT 0 - 5 RBC/hpf   WBC, UA 0-5 0 - 5 WBC/hpf   Bacteria, UA NONE SEEN NONE SEEN   Squamous Epithelial / LPF 0-5 (A) NONE SEEN   Mucous PRESENT    Ca Oxalate Crys, UA PRESENT   Pregnancy, urine POC     Status: Abnormal   Collection Time: 09/01/16 10:19 PM  Result Value Ref Range   Preg Test, Ur POSITIVE (A) NEGATIVE  Wet prep, genital     Status: Abnormal   Collection Time: 09/01/16 10:25 PM  Result Value Ref Range   Yeast Wet Prep HPF POC NONE SEEN NONE SEEN   Trich, Wet Prep NONE SEEN NONE SEEN   Clue Cells Wet Prep HPF POC NONE SEEN NONE SEEN   WBC, Wet Prep HPF POC FEW (A) NONE SEEN   Sperm NONE SEEN    US Ob Comp Less 14 Wks  Result Date: 09/01/2016 CLINICAL DATA:  Bleeding and cramping EXAM: OBSTETRIC <14 WK [place] AND TRANSVAGINAL OB US TECHNIQUE: Both transabdominal and transvaginal ultrasound examinations were performed for complete evaluation of the gestation as well as the maternal uterus, adnexal regions, and pelvic cul-de-sac. Transvaginal  technique was performed to assess early pregnancy. COMPARISON:  None. FINDINGS: Intrauterine gestational sac: Visualized Yolk sac:  Visualized Embryo:  Visualized Cardiac Activity: Visualized Heart Rate: 96  bpm CRL:  2.7  mm   5 w   5 d                  [place] EDC: 04/29/2017 Subchorionic hemorrhage:  None visualized. Maternal uterus/adnexae: Ovaries are within normal limits. The right ovary measures 3.4 x 1.1 x 2.4 cm.  The left ovary measures 4 x 1.6 x 1.9 cm. No free fluid. IMPRESSION: Single viable intrauterine gestation as described above. Electronically Signed   By: Donavan Foil M.D.   On: 09/01/2016 23:26   US Ob Transvaginal  Result Date: 09/01/2016 CLINICAL DATA:  Bleeding and cramping EXAM: OBSTETRIC <14 WK [place] AND TRANSVAGINAL OB US TECHNIQUE: Both transabdominal and transvaginal ultrasound examinations were performed for complete evaluation of the gestation as well as the maternal uterus, adnexal regions, and pelvic cul-de-sac. Transvaginal technique was performed to assess early pregnancy. COMPARISON:  None. FINDINGS: Intrauterine gestational sac: Visualized Yolk sac:  Visualized Embryo:  Visualized Cardiac Activity: Visualized Heart Rate: 96  bpm CRL:  2.7  mm   5 w   5 d                  [place] EDC: 04/29/2017 Subchorionic hemorrhage:  None visualized. Maternal uterus/adnexae: Ovaries are within normal limits. The right ovary measures 3.4 x 1.1 x 2.4 cm. The left ovary measures 4 x 1.6 x 1.9 cm. No free fluid. IMPRESSION: Single viable intrauterine gestation as described above. Electronically Signed   By: Donavan Foil M.D.   On: 09/01/2016 23:26    MDM +UPT UA, wet prep, GC/chlamydia, CBC, ABO/Rh, quant hCG, HIV, and [place] today to rule out ectopic pregnancy A positive Ultrasound shows SIUP measuring [redacted]w[redacted]d, cardiac activity present, 96 bpm S/w Dr. Melba Coon regarding assessment & ultrasound reports. Will send msg to office for f/u ultrasound to reassess fetal heart rate.  Assessment and Plan  A: 1.  Normal IUP (intrauterine pregnancy) on prenatal ultrasound, first trimester   2. Vaginal bleeding in pregnancy, first trimester    P: Discharge home Low fetal heart rate could be normal or could be concerning for miscarriage -- will need follow up Pelvic rest GC/CT pending Office will call for f/u ultrasound appointment Discussed reasons to return to [facility] 09/01/2016, 10:11 PM

## 2016-09-01 NOTE — MAU Note (Signed)
Pt called for Triage and significant other stated she was in lab. Will ck back for pt in few mins.

## 2016-09-03 DIAGNOSIS — O021 Missed abortion: Secondary | ICD-10-CM | POA: Diagnosis not present

## 2016-09-03 LAB — GC/CHLAMYDIA PROBE AMP (~~LOC~~) NOT AT ARMC
Chlamydia: NEGATIVE
NEISSERIA GONORRHEA: NEGATIVE

## 2016-09-03 MED FILL — METHERGINE 0.2 MG TABLET: 0.2 | 2 days supply | Qty: 6 | Fill #0

## 2016-09-12 DIAGNOSIS — O034 Incomplete spontaneous abortion without complication: Secondary | ICD-10-CM | POA: Diagnosis not present

## 2016-09-21 DIAGNOSIS — N898 Other specified noninflammatory disorders of vagina: Secondary | ICD-10-CM | POA: Diagnosis not present

## 2016-09-28 DIAGNOSIS — O021 Missed abortion: Secondary | ICD-10-CM | POA: Diagnosis not present

## 2016-12-12 DIAGNOSIS — Z3201 Encounter for pregnancy test, result positive: Secondary | ICD-10-CM | POA: Diagnosis not present

## 2016-12-12 DIAGNOSIS — N911 Secondary amenorrhea: Secondary | ICD-10-CM | POA: Diagnosis not present

## 2016-12-12 DIAGNOSIS — Z8759 Personal history of other complications of pregnancy, childbirth and the puerperium: Secondary | ICD-10-CM | POA: Diagnosis not present

## 2016-12-14 DIAGNOSIS — Z8759 Personal history of other complications of pregnancy, childbirth and the puerperium: Secondary | ICD-10-CM | POA: Diagnosis not present

## 2016-12-28 DIAGNOSIS — O020 Blighted ovum and nonhydatidiform mole: Secondary | ICD-10-CM | POA: Diagnosis not present

## 2017-01-04 DIAGNOSIS — O020 Blighted ovum and nonhydatidiform mole: Secondary | ICD-10-CM | POA: Diagnosis not present

## 2017-01-04 DIAGNOSIS — Z3A00 Weeks of gestation of pregnancy not specified: Secondary | ICD-10-CM | POA: Diagnosis not present

## 2017-01-07 MED FILL — HYDROCODON-APAP 5-325: 5-325 | 2 days supply | Qty: 5 | Fill #0

## 2017-01-07 MED FILL — diazePAM 5 MG TABS: 5 | 1 days supply | Qty: 2 | Fill #0

## 2017-01-07 MED FILL — PROMETHAZINE 25 MG TABLET: 25 | 1 days supply | Qty: 2 | Fill #0

## 2017-01-14 DIAGNOSIS — O021 Missed abortion: Secondary | ICD-10-CM | POA: Diagnosis not present

## 2017-01-14 HISTORY — PX: DILATION AND CURETTAGE OF UTERUS: SHX78

## 2017-01-17 ENCOUNTER — Encounter (HOSPITAL_COMMUNITY): Payer: Self-pay

## 2017-01-17 ENCOUNTER — Encounter (HOSPITAL_COMMUNITY): Payer: Self-pay | Admitting: *Deleted

## 2017-01-17 ENCOUNTER — Inpatient Hospital Stay (HOSPITAL_COMMUNITY)
Admission: AD | Admit: 2017-01-17 | Discharge: 2017-01-17 | Disposition: A | Discharge status: Home / Self Care | Payer: 59 | Source: Ambulatory Visit | Attending: Obstetrics and Gynecology | Admitting: Obstetrics and Gynecology

## 2017-01-17 DIAGNOSIS — Z87891 Personal history of nicotine dependence: Secondary | ICD-10-CM | POA: Insufficient documentation

## 2017-01-17 DIAGNOSIS — Z79899 Other long term (current) drug therapy: Secondary | ICD-10-CM | POA: Diagnosis not present

## 2017-01-17 DIAGNOSIS — Z9889 Other specified postprocedural states: Secondary | ICD-10-CM | POA: Diagnosis not present

## 2017-01-17 DIAGNOSIS — K6289 Other specified diseases of anus and rectum: Secondary | ICD-10-CM | POA: Diagnosis not present

## 2017-01-17 DIAGNOSIS — N951 Menopausal and female climacteric states: Secondary | ICD-10-CM | POA: Diagnosis not present

## 2017-01-17 DIAGNOSIS — O021 Missed abortion: Secondary | ICD-10-CM

## 2017-01-17 DIAGNOSIS — Z4889 Encounter for other specified surgical aftercare: Secondary | ICD-10-CM

## 2017-01-17 DIAGNOSIS — R109 Unspecified abdominal pain: Secondary | ICD-10-CM | POA: Diagnosis not present

## 2017-01-17 HISTORY — DX: Missed abortion: O02.1

## 2017-01-17 LAB — CBC WITH DIFFERENTIAL/PLATELET
Basophils Absolute: 0 10*3/uL (ref 0.0–0.1)
Basophils Relative: 0 %
Eosinophils Absolute: 0.1 10*3/uL (ref 0.0–0.7)
Eosinophils Relative: 1 %
HEMATOCRIT: 29.9 % — AB (ref 36.0–46.0)
HEMOGLOBIN: 10.1 g/dL — AB (ref 12.0–15.0)
LYMPHS ABS: 0.6 10*3/uL — AB (ref 0.7–4.0)
Lymphocytes Relative: 10 %
MCH: 28.1 pg (ref 26.0–34.0)
MCHC: 33.8 g/dL (ref 30.0–36.0)
MCV: 83.1 fL (ref 78.0–100.0)
MONOS PCT: 7 %
Monocytes Absolute: 0.5 10*3/uL (ref 0.1–1.0)
NEUTROS ABS: 5.3 10*3/uL (ref 1.7–7.7)
NEUTROS PCT: 82 %
Platelets: 160 10*3/uL (ref 150–400)
RBC: 3.6 MIL/uL — AB (ref 3.87–5.11)
RDW: 16 % — ABNORMAL HIGH (ref 11.5–15.5)
WBC: 6.5 10*3/uL (ref 4.0–10.5)

## 2017-01-17 NOTE — MAU Provider Note (Signed)
History     CSN: [id number]  Arrival date and time: 01/17/17 0720   None     Chief Complaint  Patient presents with  . pain in abdomen and rectum    severe pain since 01/16/17 @ 1700 , dull pain since [facility] on 01/15/17  . Hot Flashes    since 0600 01/17/17   Patient is a 26 year old G3 P1 who presents today status post D&C on Monday June 25. She reports immediately following the procedure she had some spotting but no significant vaginal bleeding and some intermittent cramping that was tolerable. Last night she had significant pain and pressure in the rectum which she was concerned was related to her surgery. She reports she was unable to even sit down on the toilet the pain was so bad. Eventually she fell asleep using a heating pad for pain relief. This morning she woke up and had a gush of blood which concerned her. She attempted to go to work but was having hot and cold flashes and had a temperature of 99.2. She took 800 mg of ibuprofen which have improved both the hot flashes and her abdominal pain. Since being here she also reports decreased bleeding.    OB History    Gravida Para Term Preterm AB Living   3 1 1   2 1    SAB TAB Ectopic Multiple Live Births   2     0 1      Past Medical History:  Diagnosis Date  . Kidney stone   . Normal intrauterine pregnancy in third trimester 08/25/2014    Past Surgical History:  Procedure Laterality Date  . DILATION AND CURETTAGE OF UTERUS    . kidney stent      Family History  Problem Relation Age of Onset  . Breast cancer Other     Social History  Substance Use Topics  . Smoking status: Former Research scientist (life sciences)  . Smokeless tobacco: Never Used  . Alcohol use No    Allergies: No Known Allergies  Prescriptions Prior to Admission  Medication Sig Dispense Refill Last Dose  . acetaminophen (TYLENOL) 325 MG tablet Take 650 mg by mouth every 6 (six) hours as needed.   09/01/2016 at Unknown time  . fluticasone (FLONASE) 50 MCG/ACT nasal spray    2   . Prenatal Vit-Fe Fumarate-FA (PRENATAL MULTIVITAMIN) TABS tablet Take 1 tablet by mouth daily at 12 noon.   09/01/2016 at Unknown time    Review of Systems  Constitutional: Positive for chills. Negative for fever.  HENT: Negative for congestion and rhinorrhea.   Respiratory: Negative for cough and shortness of breath.   Cardiovascular: Negative for chest pain and palpitations.  Gastrointestinal: Negative for abdominal distention, constipation, diarrhea, nausea and vomiting.  Genitourinary: Negative for dysuria, frequency and urgency.  Musculoskeletal: Negative for arthralgias and back pain.  Neurological: Negative for dizziness and weakness.   Physical Exam   Blood pressure 118/62, pulse 88, temperature 98.9 F (37.2 C), temperature source Oral, last menstrual period 11/06/2016, SpO2 100 %, unknown if currently breastfeeding.  Physical Exam  Vitals reviewed. Constitutional: She is oriented to person, place, and time. She appears well-developed and well-nourished.  No acute distress  HENT:  Head: Normocephalic and atraumatic.  Respiratory: Effort normal. No respiratory distress.  GI: Soft. She exhibits no distension. There is no rebound and no guarding.  Minimal abdominal tenderness in the suprapubic area  Genitourinary:  Genitourinary Comments: Minimal pain with palpation of the posterior aspect of the uterus on  bimanual exam although no significant cervical motion tenderness small amount of old clot in the vaginal vault no active bleeding cervix appears to be closing down.  Musculoskeletal: Normal range of motion.  Neurological: She is alert and oriented to person, place, and time.  Skin: Skin is warm and dry.  Psychiatric: She has a normal mood and affect. Her behavior is normal.    MAU Course  Procedures  MDM In MA U patient underwent evaluation with physical exam as well as a CBC. Patient's white blood cell count was entirely normal. No issues with anemia either.    Scopes case with Dr. Melba Coon who recommended follow-up ultrasound in her office later this week or early next week to ensure improvement of her symptoms. No need for antibiotic at this time.  Assessment and Plan  #1: Postoperative care likely patient's symptoms are within the normal limits of postoperative pain and bleeding from a D&C. Patient can follow up in her primary obstetrician's office for further care.   Renee Sanchez 01/17/2017, 8:40 AM

## 2017-01-17 NOTE — MAU Note (Signed)
Pt had D&C on June 25, had been occasional rectal pressure, became very severe around 1700 last night, woke up @ 0530 with a gush of blood, has continued to have heavier bleeding.  Also having extreme hot flashes, temp this morning was 99.6.

## 2017-01-17 NOTE — Discharge Instructions (Signed)
Dilation and Curettage or Vacuum Curettage, Care After  These instructions give you information about caring for yourself after your procedure. Your doctor may also give you more specific instructions. Call your doctor if you have any problems or questions after your procedure.  Follow these instructions at home:  Activity   · Do not drive or use heavy machinery while taking prescription pain medicine.  · For 24 hours after your procedure, avoid driving.  · Take short walks often, followed by rest periods. Ask your doctor what activities are safe for you. After one or two days, you may be able to return to your normal activities.  · Do not lift anything that is heavier than 10 lb (4.5 kg) until your doctor approves.  · For at least 2 weeks, or as long as told by your doctor:  ? Do not douche.  ? Do not use tampons.  ? Do not have sex.  General instructions   · Take over-the-counter and prescription medicines only as told by your doctor. This is very important if you take blood thinning medicine.  · Do not take baths, swim, or use a hot tub until your doctor approves. Take showers instead of baths.  · Wear compression stockings as told by your doctor.  · It is up to you to get the results of your procedure. Ask your doctor when your results will be ready.  · Keep all follow-up visits as told by your doctor. This is important.  Contact a doctor if:  · You have very bad cramps that get worse or do not get better with medicine.  · You have very bad pain in your belly (abdomen).  · You cannot drink fluids without throwing up (vomiting).  · You get pain in a different part of the area between your belly and thighs (pelvis).  · You have bad-smelling discharge from your vagina.  · You have a rash.  Get help right away if:  · You are bleeding a lot from your vagina. A lot of bleeding means soaking more than one sanitary pad in an hour, for 2 hours in a row.  · You have clumps of blood (blood clots) coming from your  vagina.  · You have a fever or chills.  · Your belly feels very tender or hard.  · You have chest pain.  · You have trouble breathing.  · You cough up blood.  · You feel dizzy.  · You feel light-headed.  · You pass out (faint).  · You have pain in your neck or shoulder area.  Summary  · Take short walks often, followed by rest periods. Ask your doctor what activities are safe for you. After one or two days, you may be able to return to your normal activities.  · Do not lift anything that is heavier than 10 lb (4.5 kg) until your doctor approves.  · Do not take baths, swim, or use a hot tub until your doctor approves. Take showers instead of baths.  · Contact your doctor if you have any symptoms of infection, like bad-smelling discharge from your vagina.  This information is not intended to replace advice given to you by your health care provider. Make sure you discuss any questions you have with your health care provider.  Document Released: 04/17/2008 Document Revised: 03/26/2016 Document Reviewed: 03/26/2016  Elsevier Interactive Patient Education © 2017 [employer]

## 2017-01-17 NOTE — H&P (Signed)
Renee Sanchez is an 26 y.o. female [phone] s/p missed AB with RPOC after D&C.  Pt going to go out of town, wants Occupational hygienist.  D/W pt r/b/a of surgery.    Pertinent Gynecological History: [id number] G1 SAB,  G2 38wk SVD female 6#15 G3 SAB (missed - in-office D&C - RPOC) + Chl remote No abn pap, last 3/16 WNL  Menstrual History:  Patient's last menstrual period was 11/06/2016 (exact date).    Past Medical History:  Diagnosis Date  . Anemia   . Anxiety   . Depression   . GERD (gastroesophageal reflux disease)   . History of kidney stones   . Normal intrauterine pregnancy in third trimester 08/25/2014  . Retained products of conception, early pregnancy 01/17/2017    Past Surgical History:  Procedure Laterality Date  . DILATION AND CURETTAGE OF UTERUS  01/14/2017  . kidney stent    . kidney stent removal and removal of kidney stone      Family History  Problem Relation Age of Onset  . Breast cancer Other   DM, uterine CA, tongue CA  Social History:  reports that she has quit smoking. She has never used smokeless tobacco. She reports that she drinks alcohol. She reports that she does not use drugs. married  Allergies: No Known Allergies  No prescriptions prior to admission.    Review of Systems  Constitutional:       Feels tired and crampy  Eyes: Negative.   Respiratory: Negative.   Cardiovascular: Negative.   Gastrointestinal: Positive for abdominal pain.  Genitourinary: Negative.   Musculoskeletal: Negative.   Skin: Negative.   Neurological: Negative.   Psychiatric/Behavioral: Negative.     Last menstrual period 11/06/2016, unknown if currently breastfeeding. Physical Exam  Constitutional: She is oriented to person, place, and time. She appears well-developed and well-nourished.  HENT:  Head: Normocephalic and atraumatic.  Cardiovascular: Normal rate and regular rhythm.   Respiratory: Effort normal and breath sounds normal. No respiratory distress.  She has no wheezes.  GI: Soft. Bowel sounds are normal. She exhibits no distension. There is no tenderness.  Musculoskeletal: Normal range of motion.  Neurological: She is alert and oriented to person, place, and time.  Skin: Skin is warm and dry.  Psychiatric: She has a normal mood and affect. Her behavior is normal.    Results for orders placed or performed during the hospital encounter of 01/17/17 (from the past 24 hour(s))  CBC with Differential     Status: Abnormal   Collection Time: 01/17/17  8:13 AM  Result Value Ref Range   WBC 6.5 4.0 - 10.5 K/uL   RBC 3.60 (L) 3.87 - 5.11 MIL/uL   Hemoglobin 10.1 (L) 12.0 - 15.0 g/dL   HCT 29.9 (L) 36.0 - 46.0 %   MCV 83.1 78.0 - 100.0 fL   MCH 28.1 26.0 - 34.0 pg   MCHC 33.8 30.0 - 36.0 g/dL   RDW 16.0 (H) 11.5 - 15.5 %   Platelets 160 150 - 400 K/uL   Neutrophils Relative % 82 %   Neutro Abs 5.3 1.7 - 7.7 K/uL   Lymphocytes Relative 10 %   Lymphs Abs 0.6 (L) 0.7 - 4.0 K/uL   Monocytes Relative 7 %   Monocytes Absolute 0.5 0.1 - 1.0 K/uL   Eosinophils Relative 1 %   Eosinophils Absolute 0.1 0.0 - 0.7 K/uL   Basophils Relative 0 %   Basophils Absolute 0.0 0.0 - 0.1 K/uL   [place] -  RPOC  Assessment/Plan: 28SK [id number] s/p D&C with RPOC D/w pt R/b/a of surgery, will proceed Doxycycline 100 po before and 200 po after  Ercia Crisafulli Bovard-Stuckert 01/17/2017, 4:59 PM

## 2017-01-18 ENCOUNTER — Ambulatory Visit (HOSPITAL_COMMUNITY): Admission: RE | Admit: 2017-01-18 | Payer: 59 | Source: Ambulatory Visit | Admitting: Obstetrics and Gynecology

## 2017-01-18 ENCOUNTER — Encounter (HOSPITAL_COMMUNITY): Admission: RE | Payer: Self-pay | Source: Ambulatory Visit

## 2017-01-18 HISTORY — DX: Anxiety disorder, unspecified: F41.9

## 2017-01-18 HISTORY — DX: Anemia, unspecified: D64.9

## 2017-01-18 HISTORY — DX: Depression, unspecified: F32.A

## 2017-01-18 HISTORY — DX: Personal history of urinary calculi: Z87.442

## 2017-01-18 HISTORY — DX: Gastro-esophageal reflux disease without esophagitis: K21.9

## 2017-01-18 HISTORY — DX: Major depressive disorder, single episode, unspecified: F32.9

## 2017-01-18 HISTORY — DX: Missed abortion: O02.1

## 2017-01-18 SURGERY — DILATION AND EVACUATION, UTERUS
Anesthesia: Choice

## 2017-01-18 MED FILL — METHERGINE 0.2 MG TABLET: 0.2 | 3 days supply | Qty: 9 | Fill #0

## 2017-01-28 DIAGNOSIS — O021 Missed abortion: Secondary | ICD-10-CM | POA: Diagnosis not present

## 2017-02-06 DIAGNOSIS — O034 Incomplete spontaneous abortion without complication: Secondary | ICD-10-CM | POA: Diagnosis not present

## 2017-02-11 DIAGNOSIS — H5213 Myopia, bilateral: Secondary | ICD-10-CM | POA: Diagnosis not present

## 2017-03-26 DIAGNOSIS — O200 Threatened abortion: Secondary | ICD-10-CM | POA: Diagnosis not present

## 2017-03-29 DIAGNOSIS — N910 Primary amenorrhea: Secondary | ICD-10-CM | POA: Diagnosis not present

## 2017-03-29 DIAGNOSIS — R8290 Unspecified abnormal findings in urine: Secondary | ICD-10-CM | POA: Diagnosis not present

## 2017-03-29 DIAGNOSIS — J029 Acute pharyngitis, unspecified: Secondary | ICD-10-CM | POA: Diagnosis not present

## 2017-04-09 DIAGNOSIS — N96 Recurrent pregnancy loss: Secondary | ICD-10-CM | POA: Diagnosis not present

## 2017-05-20 DIAGNOSIS — O200 Threatened abortion: Secondary | ICD-10-CM | POA: Diagnosis not present

## 2017-06-10 DIAGNOSIS — Z1389 Encounter for screening for other disorder: Secondary | ICD-10-CM | POA: Diagnosis not present

## 2017-06-10 DIAGNOSIS — N96 Recurrent pregnancy loss: Secondary | ICD-10-CM | POA: Diagnosis not present

## 2017-06-10 DIAGNOSIS — Z130 Encounter for screening for diseases of the blood and blood-forming organs and certain disorders involving the immune mechanism: Secondary | ICD-10-CM | POA: Diagnosis not present

## 2017-06-10 DIAGNOSIS — Z124 Encounter for screening for malignant neoplasm of cervix: Secondary | ICD-10-CM | POA: Diagnosis not present

## 2017-06-10 DIAGNOSIS — R82998 Other abnormal findings in urine: Secondary | ICD-10-CM | POA: Diagnosis not present

## 2017-06-10 DIAGNOSIS — Z1151 Encounter for screening for human papillomavirus (HPV): Secondary | ICD-10-CM | POA: Diagnosis not present

## 2017-06-10 DIAGNOSIS — Z01419 Encounter for gynecological examination (general) (routine) without abnormal findings: Secondary | ICD-10-CM | POA: Diagnosis not present

## 2017-09-02 ENCOUNTER — Other Ambulatory Visit: Payer: Self-pay | Admitting: Family Medicine

## 2017-09-02 DIAGNOSIS — R29898 Other symptoms and signs involving the musculoskeletal system: Secondary | ICD-10-CM

## 2017-09-02 DIAGNOSIS — R519 Headache, unspecified: Secondary | ICD-10-CM

## 2017-09-02 DIAGNOSIS — R51 Headache: Secondary | ICD-10-CM

## 2017-09-02 DIAGNOSIS — L680 Hirsutism: Secondary | ICD-10-CM

## 2017-09-07 ENCOUNTER — Ambulatory Visit
Admission: RE | Admit: 2017-09-07 | Discharge: 2017-09-07 | Disposition: A | Discharge status: Home / Self Care | Payer: Self-pay | Source: Ambulatory Visit | Attending: Family Medicine | Admitting: Family Medicine

## 2017-09-07 DIAGNOSIS — R51 Headache: Secondary | ICD-10-CM

## 2017-09-07 DIAGNOSIS — L680 Hirsutism: Secondary | ICD-10-CM

## 2017-09-07 DIAGNOSIS — R29898 Other symptoms and signs involving the musculoskeletal system: Secondary | ICD-10-CM

## 2017-09-07 DIAGNOSIS — R519 Headache, unspecified: Secondary | ICD-10-CM

## 2017-09-07 MED ORDER — GADOBENATE DIMEGLUMINE 529 MG/ML IV SOLN
7.0000 mL | Freq: Once | INTRAVENOUS | Status: AC | PRN
  Administered 2017-09-07: 7 mL via INTRAVENOUS

## 2018-07-23 NOTE — L&D Delivery Note (Addendum)
Delivery Note Variable decelerations improved but did not totally resolve with amnioinsusion, and pt progressed quickly to complete dilation.  She then pushed about 20 minutes and at 2:04 AM a healthy female was delivered via Vaginal, Spontaneous (Presentation: ROA  ).  APGAR: 7, 9; weight pending .   Placenta status: delivered spontaneously .  Cord:  with the following complications: double nuchal delivered through.    Anesthesia:  epidural Episiotomy: None Lacerations:  First degree Suture Repair: 3.0 vicryl rapide Est. Blood Loss (mL):    Mom to postpartum.  Baby to Couplet care / Skin to Skin, baby with mild flaring and grunting so will be observed with pulse ox in place to see if needs to go to nursery.  Pt desires circumcision in hospital and has paid at office.  Logan Bores 04/19/2019, 2:31 AM

## 2018-08-26 MED FILL — ALPRAZolam 0.25 MG TABS: 0.25 | 30 days supply | Qty: 30 | Fill #0

## 2018-10-14 MED FILL — PROGESTERONE 200 MG CAPSULE: 200 | 30 days supply | Qty: 30 | Fill #0

## 2018-12-08 ENCOUNTER — Other Ambulatory Visit: Payer: Self-pay

## 2018-12-08 ENCOUNTER — Telehealth: Payer: Self-pay | Admitting: Family Medicine

## 2018-12-08 DIAGNOSIS — R6889 Other general symptoms and signs: Secondary | ICD-10-CM

## 2018-12-08 NOTE — Telephone Encounter (Signed)
Pt called stated that she filled out the  health at survey and answered yes to having a sore throat . She said that she is not have any other symptom and this started yesterday. Informed pt that she will need to tested. Put in the order for drive thur test ,and made an appt for pt to go in the morning 12/09/18 to the [place] testing site.

## 2018-12-09 ENCOUNTER — Other Ambulatory Visit: Payer: No Typology Code available for payment source

## 2018-12-09 DIAGNOSIS — R6889 Other general symptoms and signs: Secondary | ICD-10-CM

## 2018-12-11 LAB — NOVEL CORONAVIRUS, NAA: SARS-CoV-2, NAA: NOT DETECTED

## 2019-03-23 ENCOUNTER — Encounter (HOSPITAL_COMMUNITY): Payer: Self-pay | Admitting: *Deleted

## 2019-03-23 ENCOUNTER — Inpatient Hospital Stay (HOSPITAL_COMMUNITY)
Admission: AD | Admit: 2019-03-23 | Discharge: 2019-03-23 | Disposition: A | Discharge status: Home / Self Care | Payer: No Typology Code available for payment source | Attending: Obstetrics and Gynecology | Admitting: Obstetrics and Gynecology

## 2019-03-23 ENCOUNTER — Other Ambulatory Visit: Payer: Self-pay

## 2019-03-23 DIAGNOSIS — O479 False labor, unspecified: Secondary | ICD-10-CM

## 2019-03-23 DIAGNOSIS — Z87891 Personal history of nicotine dependence: Secondary | ICD-10-CM | POA: Insufficient documentation

## 2019-03-23 DIAGNOSIS — O4703 False labor before 37 completed weeks of gestation, third trimester: Secondary | ICD-10-CM | POA: Diagnosis not present

## 2019-03-23 DIAGNOSIS — Z3A32 32 weeks gestation of pregnancy: Secondary | ICD-10-CM | POA: Diagnosis not present

## 2019-03-23 MED ORDER — NIFEDIPINE 10 MG PO CAPS: 10.0000 mg | ORAL_CAPSULE | ORAL | Status: DC

## 2019-03-23 MED ORDER — BETAMETHASONE SOD PHOS & ACET 6 (3-3) MG/ML IJ SUSP
12.0000 mg | Freq: Once | INTRAMUSCULAR | Status: AC
  Administered 2019-03-23: 16:00:00 12 mg via INTRAMUSCULAR
  Filled 2019-03-23: qty 2

## 2019-03-23 MED ORDER — BETAMETHASONE SOD PHOS & ACET 6 (3-3) MG/ML IJ SUSP: 12.0000 mg | Freq: Once | INTRAMUSCULAR | Status: DC

## 2019-03-23 NOTE — MAU Note (Signed)
Pt taken to rm for further eval after discussion and pt stating still contracting.  Concerned, because this is how labor felt with first preg.

## 2019-03-23 NOTE — MAU Note (Addendum)
Sent from office for Betamethasone.  Pt given fluids per MD, pt states- just drinking the water she had with her, no meds.  cx was 1cm on exam.

## 2019-03-23 NOTE — MAU Note (Signed)
Pt states she has been contracting all morning.  Was sent from office for [place].  She is feeling them about every 10 minutes.  No VB, LOF and is feeling the baby move.

## 2019-03-23 NOTE — MAU Provider Note (Signed)
History     CSN: [id number]  Arrival date and time: 03/23/19 1521   First Provider Initiated Contact with Patient 03/23/19 1617      Chief Complaint  Patient presents with  . betamethasone injection only   Renee Sanchez is a 28 y.o. [id number] at [redacted]w[redacted]d who presents today from the office for a BMZ injection. She states that this morning around 1000 she started to have contractions about 10 mins apart. She was seen in the office and monitored there and they collected FFN. The FFN results are still pending. She was 1cm at the office around 14:14 and she was advised to come here for BMZ. Patient states that she wasn't sure why she wasn't given any medication to stop contractions. She denies VB or LOF. She reports normal fetal movement.    OB History    Gravida  4   Para  1   Term  1   Preterm      AB  2   Living  1     SAB  2   TAB      Ectopic      Multiple  0   Live Births  1           Past Medical History:  Diagnosis Date  . Anemia   . Anxiety   . Depression   . GERD (gastroesophageal reflux disease)   . History of kidney stones   . Normal intrauterine pregnancy in third trimester 08/25/2014  . Retained products of conception, early pregnancy 01/17/2017    Past Surgical History:  Procedure Laterality Date  . DILATION AND CURETTAGE OF UTERUS  01/14/2017  . kidney stent    . kidney stent removal and removal of kidney stone      Family History  Problem Relation Age of Onset  . Breast cancer Other     Social History   Tobacco Use  . Smoking status: Former Research scientist (life sciences)  . Smokeless tobacco: Never Used  Substance Use Topics  . Alcohol use: Yes    Comment: social  . Drug use: No    Allergies: No Known Allergies  Medications Prior to Admission  Medication Sig Dispense Refill Last Dose  . Prenatal Vit-Fe Fumarate-FA (PRENATAL MULTIVITAMIN) TABS tablet Take 1 tablet by mouth daily at 12 noon.   03/23/2019 at Unknown time    Review of Systems  All  other systems reviewed and are negative.  Physical Exam   Blood pressure 126/74, pulse 85, temperature 98.1 F (36.7 C), temperature source Oral, resp. rate 16, height 5\' 3"  (1.6 m), weight 79.4 kg, SpO2 99 %, unknown if currently breastfeeding.  Physical Exam  Nursing note and vitals reviewed. Constitutional: She is oriented to person, place, and time. She appears well-developed and well-nourished. No distress.  HENT:  Head: Normocephalic.  Cardiovascular: Normal rate.  Respiratory: Effort normal.  GI: Soft. There is no abdominal tenderness.  Genitourinary:    Genitourinary Comments:  Dilation: 1 Effacement (%): Thick Cervical Position: Posterior Exam by:: CNM    Neurological: She is alert and oriented to person, place, and time.  Skin: Skin is warm and dry.  Psychiatric: She has a normal mood and affect.   NST:  Baseline: 140 Variability: moderate Accels: 15x15 Decels: none Toco: none, but UI  MAU Course  Procedures  MDM DW patient that there has been no cervical change since her exam in the office at 2:15, and that since she is feeling like the contractions as spacing  out then what we are seeing on the monitor is more irritability. Patient reassured that tocolysis would not have a huge benefit at this time unless she is very uncomfortable. I think that the plan to return tomorrow for bmz and monitor symptoms at home is reasonable at this time. Reviewed with patient the warning signs that she should return sooner for.   Assessment and Plan   1. Braxton Hicks contractions   2. Threatened premature labor in third trimester   3. [redacted] weeks gestation of pregnancy    DC home Comfort measures reviewed  3rd Trimester precautions  PTL precautions  Fetal kick counts RX: none  Return to MAU as needed FU with OB as planned  Follow-up Information    [facility] Follow up.   Specialty: Obstetrics and Gynecology Why: Return tomorrow for steriod shot   Contact information: [address] [id number] [place]       Janyth Contes, MD Follow up.   Specialty: Obstetrics and Gynecology Why: Keep your appointment as scheduled  Contact information: [address] [place] [address] [place] DNP, CNM  03/23/19  4:42 PM

## 2019-03-23 NOTE — Discharge Instructions (Signed)
Braxton Hicks Contractions °Contractions of the uterus can occur throughout pregnancy, but they are not always a sign that you are in labor. You may have practice contractions called Braxton Hicks contractions. These false labor contractions are sometimes confused with true labor. °What are Braxton Hicks contractions? °Braxton Hicks contractions are tightening movements that occur in the muscles of the uterus before labor. Unlike true labor contractions, these contractions do not result in opening (dilation) and thinning of the cervix. Toward the end of pregnancy (32-34 weeks), Braxton Hicks contractions can happen more often and may become stronger. These contractions are sometimes difficult to tell apart from true labor because they can be very uncomfortable. You should not feel embarrassed if you go to the hospital with false labor. °Sometimes, the only way to tell if you are in true labor is for your health care provider to look for changes in the cervix. The health care provider will do a physical exam and may monitor your contractions. If you are not in true labor, the exam should show that your cervix is not dilating and your water has not broken. °If there are no other health problems associated with your pregnancy, it is completely safe for you to be sent home with false labor. You may continue to have Braxton Hicks contractions until you go into true labor. °How to tell the difference between true labor and false labor °True labor °· Contractions last 30-70 seconds. °· Contractions become very regular. °· Discomfort is usually felt in the top of the uterus, and it spreads to the lower abdomen and low back. °· Contractions do not go away with walking. °· Contractions usually become more intense and increase in frequency. °· The cervix dilates and gets thinner. °False labor °· Contractions are usually shorter and not as strong as true labor contractions. °· Contractions are usually irregular. °· Contractions  are often felt in the front of the lower abdomen and in the groin. °· Contractions may go away when you walk around or change positions while lying down. °· Contractions get weaker and are shorter-lasting as time goes on. °· The cervix usually does not dilate or become thin. °Follow these instructions at home: ° °· Take over-the-counter and prescription medicines only as told by your health care provider. °· Keep up with your usual exercises and follow other instructions from your health care provider. °· Eat and drink lightly if you think you are going into labor. °· If Braxton Hicks contractions are making you uncomfortable: °? Change your position from lying down or resting to walking, or change from walking to resting. °? Sit and rest in a tub of warm water. °? Drink enough fluid to keep your urine pale yellow. Dehydration may cause these contractions. °? Do slow and deep breathing several times an hour. °· Keep all follow-up prenatal visits as told by your health care provider. This is important. °Contact a health care provider if: °· You have a fever. °· You have continuous pain in your abdomen. °Get help right away if: °· Your contractions become stronger, more regular, and closer together. °· You have fluid leaking or gushing from your vagina. °· You pass blood-tinged mucus (bloody show). °· You have bleeding from your vagina. °· You have low back pain that you never had before. °· You feel your baby’s head pushing down and causing pelvic pressure. °· Your baby is not moving inside you as much as it used to. °Summary °· Contractions that occur before labor are   called Braxton Hicks contractions, false labor, or practice contractions.  Braxton Hicks contractions are usually shorter, weaker, farther apart, and less regular than true labor contractions. True labor contractions usually become progressively stronger and regular, and they become more frequent.  Manage discomfort from Braxton Hicks contractions  by changing position, resting in a warm bath, drinking plenty of water, or practicing deep breathing. This information is not intended to replace advice given to you by your health care provider. Make sure you discuss any questions you have with your health care provider. Document Released: 11/22/2016 Document Revised: 06/21/2017 Document Reviewed: 11/22/2016 Elsevier Patient Education  2020 Else[employer] Inc[employer]  

## 2019-03-23 NOTE — MAU Note (Signed)
Pt states is still contracting, had 'several' while in waiting rm.  fFN sent at office.

## 2019-03-24 ENCOUNTER — Other Ambulatory Visit: Payer: Self-pay

## 2019-03-24 ENCOUNTER — Inpatient Hospital Stay (HOSPITAL_COMMUNITY)
Admission: AD | Admit: 2019-03-24 | Discharge: 2019-03-24 | Disposition: A | Discharge status: Home / Self Care | Payer: No Typology Code available for payment source | Attending: Obstetrics and Gynecology | Admitting: Obstetrics and Gynecology

## 2019-03-24 DIAGNOSIS — O479 False labor, unspecified: Secondary | ICD-10-CM | POA: Diagnosis not present

## 2019-03-24 DIAGNOSIS — Z3A00 Weeks of gestation of pregnancy not specified: Secondary | ICD-10-CM | POA: Diagnosis not present

## 2019-03-24 MED ORDER — BETAMETHASONE SOD PHOS & ACET 6 (3-3) MG/ML IJ SUSP
12.0000 mg | Freq: Once | INTRAMUSCULAR | Status: AC
  Administered 2019-03-24: 12 mg via INTRAMUSCULAR
  Filled 2019-03-24: qty 2

## 2019-03-24 NOTE — MAU Note (Signed)
Pt reports that she is here for her second steroid injection.   Pt reports loosing what she thinks is her mucous plug at home today. Pt is unsure if she needs to be seen for this today or not.   Denies pain, no other complaints.   Reports +FM

## 2019-04-13 ENCOUNTER — Encounter (HOSPITAL_COMMUNITY): Payer: Self-pay

## 2019-04-13 ENCOUNTER — Other Ambulatory Visit: Payer: Self-pay

## 2019-04-13 ENCOUNTER — Inpatient Hospital Stay (HOSPITAL_COMMUNITY)
Admission: AD | Admit: 2019-04-13 | Discharge: 2019-04-13 | Disposition: A | Discharge status: Home / Self Care | Payer: No Typology Code available for payment source | Attending: Obstetrics and Gynecology | Admitting: Obstetrics and Gynecology

## 2019-04-13 DIAGNOSIS — O99343 Other mental disorders complicating pregnancy, third trimester: Secondary | ICD-10-CM | POA: Insufficient documentation

## 2019-04-13 DIAGNOSIS — Z3689 Encounter for other specified antenatal screening: Secondary | ICD-10-CM

## 2019-04-13 DIAGNOSIS — Z87891 Personal history of nicotine dependence: Secondary | ICD-10-CM | POA: Insufficient documentation

## 2019-04-13 DIAGNOSIS — Z3A35 35 weeks gestation of pregnancy: Secondary | ICD-10-CM | POA: Insufficient documentation

## 2019-04-13 DIAGNOSIS — K219 Gastro-esophageal reflux disease without esophagitis: Secondary | ICD-10-CM | POA: Diagnosis not present

## 2019-04-13 DIAGNOSIS — F419 Anxiety disorder, unspecified: Secondary | ICD-10-CM | POA: Insufficient documentation

## 2019-04-13 DIAGNOSIS — O4703 False labor before 37 completed weeks of gestation, third trimester: Secondary | ICD-10-CM

## 2019-04-13 DIAGNOSIS — O99613 Diseases of the digestive system complicating pregnancy, third trimester: Secondary | ICD-10-CM | POA: Diagnosis not present

## 2019-04-13 LAB — URINALYSIS, ROUTINE W REFLEX MICROSCOPIC
Bilirubin Urine: NEGATIVE
Glucose, UA: NEGATIVE mg/dL
Hgb urine dipstick: NEGATIVE
Ketones, ur: 5 mg/dL — AB
Nitrite: NEGATIVE
Protein, ur: NEGATIVE mg/dL
Specific Gravity, Urine: 1.012 (ref 1.005–1.030)
pH: 6 (ref 5.0–8.0)

## 2019-04-13 MED ORDER — NIFEDIPINE 10 MG PO CAPS: 10.0000 mg | ORAL_CAPSULE | Freq: Once | ORAL | Status: DC

## 2019-04-13 MED ORDER — NIFEDIPINE 10 MG PO CAPS
10.0000 mg | ORAL_CAPSULE | Freq: Once | ORAL | Status: AC
  Administered 2019-04-13: 10 mg via ORAL
  Filled 2019-04-13: qty 1

## 2019-04-13 MED ORDER — LACTATED RINGERS IV BOLUS
1000.0000 mL | Freq: Once | INTRAVENOUS | Status: AC
  Administered 2019-04-13: 18:00:00 1000 mL via INTRAVENOUS

## 2019-04-13 MED ORDER — NIFEDIPINE 10 MG PO CAPS
10.0000 mg | ORAL_CAPSULE | Freq: Once | ORAL | Status: AC
  Administered 2019-04-13: 19:00:00 10 mg via ORAL
  Filled 2019-04-13: qty 1

## 2019-04-13 NOTE — MAU Note (Signed)
.   Renee Sanchez is a 28 y.o. at [redacted]w[redacted]d here in MAU reporting: that she is having contractions 5 mins apart since 2 today. Denies any VB or LOF LMP: Onset of complaint: 2pm, Pain score: 3 Vitals:   04/13/19 1638  BP: 124/82  Pulse: (!) 108  Resp: 16  Temp: 98.3 F (36.8 C)     FHT:130 Lab orders placed from triage:

## 2019-04-13 NOTE — MAU Provider Note (Signed)
History     CSN: [id number]  Arrival date and time: 04/13/19 1621   First Provider Initiated Contact with Patient 04/13/19 1700     28 y.o. [id number] @35 .4 wks presenting with ctx. Ctx started around 2pm. Frequency is q5 min. Denies VB or LOF. +FM. Denies urinary sx. No recent IC. Hx of preterm contractions and dilated 1cm at 32 weeks. She is worried she won't know when she is in labor. Reports not feeling painful contraction when in active labor last pregnancy.   OB History    Gravida  4   Para  1   Term  1   Preterm      AB  2   Living  1     SAB  2   TAB      Ectopic      Multiple  0   Live Births  1           Past Medical History:  Diagnosis Date  . Anemia   . Anxiety   . Depression   . GERD (gastroesophageal reflux disease)   . History of kidney stones   . Normal intrauterine pregnancy in third trimester 08/25/2014  . Retained products of conception, early pregnancy 01/17/2017    Past Surgical History:  Procedure Laterality Date  . DILATION AND CURETTAGE OF UTERUS  01/14/2017  . kidney stent    . kidney stent removal and removal of kidney stone      Family History  Problem Relation Age of Onset  . Breast cancer Other     Social History   Tobacco Use  . Smoking status: Former Research scientist (life sciences)  . Smokeless tobacco: Never Used  Substance Use Topics  . Alcohol use: Yes    Comment: social  . Drug use: No    Allergies: No Known Allergies  Medications Prior to Admission  Medication Sig Dispense Refill Last Dose  . Prenatal Vit-Fe Fumarate-FA (PRENATAL MULTIVITAMIN) TABS tablet Take 1 tablet by mouth daily at 12 noon.       Review of Systems  Gastrointestinal: Positive for abdominal pain (ctx).  Genitourinary: Negative for dysuria, hematuria, urgency, vaginal bleeding and vaginal discharge.   Physical Exam   Blood pressure 115/67, pulse (!) 118, temperature 98.3 F (36.8 C), resp. rate 16, SpO2 98 %, unknown if currently  breastfeeding.  Physical Exam  Nursing note and vitals reviewed. Constitutional: She is oriented to person, place, and time. She appears well-developed and well-nourished.  HENT:  Head: Normocephalic and atraumatic.  Neck: Normal range of motion.  Respiratory: Effort normal. No respiratory distress.  Genitourinary:    Genitourinary Comments: VE: 1/60/-2, vtx   Musculoskeletal: Normal range of motion.  Neurological: She is alert and oriented to person, place, and time.  Skin: Skin is warm and dry.  Psychiatric: She has a normal mood and affect.  EFM: 135 bpm, mod variability, + accels, no decels Toco: q2-4  Results for orders placed or performed during the hospital encounter of 04/13/19 (from the past 24 hour(s))  Urinalysis, Routine w reflex microscopic     Status: Abnormal   Collection Time: 04/13/19  5:30 PM  Result Value Ref Range   Color, Urine YELLOW YELLOW   APPearance HAZY (A) CLEAR   Specific Gravity, Urine 1.012 1.005 - 1.030   pH 6.0 5.0 - 8.0   Glucose, UA NEGATIVE NEGATIVE mg/dL   Hgb urine dipstick NEGATIVE NEGATIVE   Bilirubin Urine NEGATIVE NEGATIVE   Ketones, ur 5 (A) NEGATIVE mg/dL  Protein, ur NEGATIVE NEGATIVE mg/dL   Nitrite NEGATIVE NEGATIVE   Leukocytes,Ua LARGE (A) NEGATIVE   RBC / HPF 0-5 0 - 5 RBC/hpf   WBC, UA 0-5 0 - 5 WBC/hpf   Bacteria, UA RARE (A) NONE SEEN   Squamous Epithelial / LPF 0-5 0 - 5   Mucus PRESENT     MAU Course  Procedures Meds ordered this encounter  Medications  . NIFEdipine (PROCARDIA) capsule 10 mg  . lactated ringers bolus 1,000 mL  . NIFEdipine (PROCARDIA) capsule 10 mg  . NIFEdipine (PROCARDIA) capsule 10 mg   MDM Labs ordered and reviewed. Procardia x2 doses. Pt reports feeling ctx but not as painful. Cervix unchanged. Pt feels reassured and would like to go home. No signs of PTL. Stable for discharge home.  Assessment and Plan   1. [redacted] weeks gestation of pregnancy   2. NST (non-stress test) reactive   3.  Preterm uterine contractions, antepartum, third trimester    Discharge home Follow [facility] at [facility] as scheduled in 2 days  PTL precautions  Allergies as of 04/13/2019   No Known Allergies     Medication List    TAKE these medications   prenatal multivitamin Tabs tablet Take 1 tablet by mouth daily at 12 noon.      Julianne Handler, CNM 04/13/2019, 7:28 PM

## 2019-04-13 NOTE — Discharge Instructions (Signed)
Braxton Hicks Contractions °Contractions of the uterus can occur throughout pregnancy, but they are not always a sign that you are in labor. You may have practice contractions called Braxton Hicks contractions. These false labor contractions are sometimes confused with true labor. °What are Braxton Hicks contractions? °Braxton Hicks contractions are tightening movements that occur in the muscles of the uterus before labor. Unlike true labor contractions, these contractions do not result in opening (dilation) and thinning of the cervix. Toward the end of pregnancy (32-34 weeks), Braxton Hicks contractions can happen more often and may become stronger. These contractions are sometimes difficult to tell apart from true labor because they can be very uncomfortable. You should not feel embarrassed if you go to the hospital with false labor. °Sometimes, the only way to tell if you are in true labor is for your health care provider to look for changes in the cervix. The health care provider will do a physical exam and may monitor your contractions. If you are not in true labor, the exam should show that your cervix is not dilating and your water has not broken. °If there are no other health problems associated with your pregnancy, it is completely safe for you to be sent home with false labor. You may continue to have Braxton Hicks contractions until you go into true labor. °How to tell the difference between true labor and false labor °True labor °· Contractions last 30-70 seconds. °· Contractions become very regular. °· Discomfort is usually felt in the top of the uterus, and it spreads to the lower abdomen and low back. °· Contractions do not go away with walking. °· Contractions usually become more intense and increase in frequency. °· The cervix dilates and gets thinner. °False labor °· Contractions are usually shorter and not as strong as true labor contractions. °· Contractions are usually irregular. °· Contractions  are often felt in the front of the lower abdomen and in the groin. °· Contractions may go away when you walk around or change positions while lying down. °· Contractions get weaker and are shorter-lasting as time goes on. °· The cervix usually does not dilate or become thin. °Follow these instructions at home: ° °· Take over-the-counter and prescription medicines only as told by your health care provider. °· Keep up with your usual exercises and follow other instructions from your health care provider. °· Eat and drink lightly if you think you are going into labor. °· If Braxton Hicks contractions are making you uncomfortable: °? Change your position from lying down or resting to walking, or change from walking to resting. °? Sit and rest in a tub of warm water. °? Drink enough fluid to keep your urine pale yellow. Dehydration may cause these contractions. °? Do slow and deep breathing several times an hour. °· Keep all follow-up prenatal visits as told by your health care provider. This is important. °Contact a health care provider if: °· You have a fever. °· You have continuous pain in your abdomen. °Get help right away if: °· Your contractions become stronger, more regular, and closer together. °· You have fluid leaking or gushing from your vagina. °· You pass blood-tinged mucus (bloody show). °· You have bleeding from your vagina. °· You have low back pain that you never had before. °· You feel your baby’s head pushing down and causing pelvic pressure. °· Your baby is not moving inside you as much as it used to. °Summary °· Contractions that occur before labor are   called Braxton Hicks contractions, false labor, or practice contractions.  Braxton Hicks contractions are usually shorter, weaker, farther apart, and less regular than true labor contractions. True labor contractions usually become progressively stronger and regular, and they become more frequent.  Manage discomfort from Braxton Hicks contractions  by changing position, resting in a warm bath, drinking plenty of water, or practicing deep breathing. This information is not intended to replace advice given to you by your health care provider. Make sure you discuss any questions you have with your health care provider. Document Released: 11/22/2016 Document Revised: 06/21/2017 Document Reviewed: 11/22/2016 Elsevier Patient Education  2020 Else[employer] Inc[employer]  

## 2019-04-15 MED FILL — CYCLOBENZAPRINE 10 MG TAB: 10 | 10 days supply | Qty: 30 | Fill #0

## 2019-04-18 ENCOUNTER — Inpatient Hospital Stay (HOSPITAL_COMMUNITY): Payer: No Typology Code available for payment source | Admitting: Anesthesiology

## 2019-04-18 ENCOUNTER — Other Ambulatory Visit: Payer: Self-pay

## 2019-04-18 ENCOUNTER — Encounter (HOSPITAL_COMMUNITY): Payer: Self-pay

## 2019-04-18 ENCOUNTER — Inpatient Hospital Stay (HOSPITAL_COMMUNITY)
Admission: AD | Admit: 2019-04-18 | Discharge: 2019-04-21 | DRG: 807 | Disposition: A | Discharge status: Home / Self Care | Payer: No Typology Code available for payment source | Attending: Obstetrics and Gynecology | Admitting: Obstetrics and Gynecology

## 2019-04-18 DIAGNOSIS — Z87891 Personal history of nicotine dependence: Secondary | ICD-10-CM

## 2019-04-18 DIAGNOSIS — O9902 Anemia complicating childbirth: Secondary | ICD-10-CM | POA: Diagnosis present

## 2019-04-18 DIAGNOSIS — D649 Anemia, unspecified: Secondary | ICD-10-CM | POA: Diagnosis present

## 2019-04-18 DIAGNOSIS — Z3A36 36 weeks gestation of pregnancy: Secondary | ICD-10-CM

## 2019-04-18 DIAGNOSIS — Z20828 Contact with and (suspected) exposure to other viral communicable diseases: Secondary | ICD-10-CM | POA: Diagnosis present

## 2019-04-18 DIAGNOSIS — O26893 Other specified pregnancy related conditions, third trimester: Secondary | ICD-10-CM | POA: Diagnosis present

## 2019-04-18 LAB — URINALYSIS, ROUTINE W REFLEX MICROSCOPIC
Bilirubin Urine: NEGATIVE
Glucose, UA: NEGATIVE mg/dL
Hgb urine dipstick: NEGATIVE
Ketones, ur: NEGATIVE mg/dL
Nitrite: NEGATIVE
Protein, ur: NEGATIVE mg/dL
Specific Gravity, Urine: 1.008 (ref 1.005–1.030)
pH: 8 (ref 5.0–8.0)

## 2019-04-18 LAB — POCT FERN TEST: POCT Fern Test: POSITIVE

## 2019-04-18 LAB — CBC
HCT: 33.6 % — ABNORMAL LOW (ref 36.0–46.0)
Hemoglobin: 11.4 g/dL — ABNORMAL LOW (ref 12.0–15.0)
MCH: 29 pg (ref 26.0–34.0)
MCHC: 33.9 g/dL (ref 30.0–36.0)
MCV: 85.5 fL (ref 80.0–100.0)
Platelets: 195 10*3/uL (ref 150–400)
RBC: 3.93 MIL/uL (ref 3.87–5.11)
RDW: 13.7 % (ref 11.5–15.5)
WBC: 10.4 10*3/uL (ref 4.0–10.5)
nRBC: 0 % (ref 0.0–0.2)

## 2019-04-18 LAB — SARS CORONAVIRUS 2 BY RT PCR (HOSPITAL ORDER, PERFORMED IN ~~LOC~~ HOSPITAL LAB): SARS Coronavirus 2: NEGATIVE

## 2019-04-18 LAB — ABO/RH: ABO/RH(D): A POS

## 2019-04-18 LAB — TYPE AND SCREEN
ABO/RH(D): A POS
Antibody Screen: NEGATIVE

## 2019-04-18 MED ORDER — TERBUTALINE SULFATE 1 MG/ML IJ SOLN: 0.2500 mg | Freq: Once | INTRAMUSCULAR | Status: DC | PRN

## 2019-04-18 MED ORDER — FENTANYL-BUPIVACAINE-NACL 0.5-0.125-0.9 MG/250ML-% EP SOLN
12.0000 mL/h | EPIDURAL | Status: DC | PRN
  Filled 2019-04-18: qty 250

## 2019-04-18 MED ORDER — OXYCODONE-ACETAMINOPHEN 5-325 MG PO TABS: 1.0000 | ORAL_TABLET | ORAL | Status: DC | PRN

## 2019-04-18 MED ORDER — EPHEDRINE 5 MG/ML INJ: 10.0000 mg | INTRAVENOUS | Status: DC | PRN

## 2019-04-18 MED ORDER — SODIUM CHLORIDE (PF) 0.9 % IJ SOLN
INTRAMUSCULAR | Status: DC | PRN
  Administered 2019-04-18: 12 mL/h via EPIDURAL

## 2019-04-18 MED ORDER — OXYTOCIN 40 UNITS IN NORMAL SALINE INFUSION - SIMPLE MED: 2.5000 [IU]/h | INTRAVENOUS | Status: DC

## 2019-04-18 MED ORDER — LIDOCAINE HCL (PF) 1 % IJ SOLN
INTRAMUSCULAR | Status: DC | PRN
  Administered 2019-04-18: 11 mL via EPIDURAL

## 2019-04-18 MED ORDER — FENTANYL CITRATE (PF) 100 MCG/2ML IJ SOLN: 50.0000 ug | INTRAMUSCULAR | Status: DC | PRN

## 2019-04-18 MED ORDER — PHENYLEPHRINE 40 MCG/ML (10ML) SYRINGE FOR IV PUSH (FOR BLOOD PRESSURE SUPPORT)
80.0000 ug | PREFILLED_SYRINGE | INTRAVENOUS | Status: DC | PRN
  Filled 2019-04-18: qty 10

## 2019-04-18 MED ORDER — DIPHENHYDRAMINE HCL 50 MG/ML IJ SOLN: 12.5000 mg | INTRAMUSCULAR | Status: DC | PRN

## 2019-04-18 MED ORDER — PHENYLEPHRINE 40 MCG/ML (10ML) SYRINGE FOR IV PUSH (FOR BLOOD PRESSURE SUPPORT)
80.0000 ug | PREFILLED_SYRINGE | INTRAVENOUS | Status: DC | PRN
  Administered 2019-04-18: 21:00:00 80 ug via INTRAVENOUS

## 2019-04-18 MED ORDER — LIDOCAINE HCL (PF) 1 % IJ SOLN: 30.0000 mL | INTRAMUSCULAR | Status: DC | PRN

## 2019-04-18 MED ORDER — LACTATED RINGERS IV SOLN
INTRAVENOUS | Status: DC
  Administered 2019-04-18 – 2019-04-19 (×2): via INTRAVENOUS

## 2019-04-18 MED ORDER — SOD CITRATE-CITRIC ACID 500-334 MG/5ML PO SOLN: 30.0000 mL | ORAL | Status: DC | PRN

## 2019-04-18 MED ORDER — OXYTOCIN 40 UNITS IN NORMAL SALINE INFUSION - SIMPLE MED
1.0000 m[IU]/min | INTRAVENOUS | Status: DC
  Administered 2019-04-18: 2 m[IU]/min via INTRAVENOUS
  Filled 2019-04-18: qty 1000

## 2019-04-18 MED ORDER — LACTATED RINGERS IV SOLN
500.0000 mL | INTRAVENOUS | Status: DC | PRN
  Administered 2019-04-18: 500 mL via INTRAVENOUS

## 2019-04-18 MED ORDER — ONDANSETRON HCL 4 MG/2ML IJ SOLN: 4.0000 mg | Freq: Four times a day (QID) | INTRAMUSCULAR | Status: DC | PRN

## 2019-04-18 MED ORDER — ACETAMINOPHEN 325 MG PO TABS: 650.0000 mg | ORAL_TABLET | ORAL | Status: DC | PRN

## 2019-04-18 MED ORDER — LACTATED RINGERS IV SOLN
500.0000 mL | Freq: Once | INTRAVENOUS | Status: AC
  Administered 2019-04-18: 20:00:00 500 mL via INTRAVENOUS

## 2019-04-18 MED ORDER — OXYCODONE-ACETAMINOPHEN 5-325 MG PO TABS: 2.0000 | ORAL_TABLET | ORAL | Status: DC | PRN

## 2019-04-18 MED ORDER — OXYTOCIN BOLUS FROM INFUSION
500.0000 mL | Freq: Once | INTRAVENOUS | Status: AC
  Administered 2019-04-19: 500 mL via INTRAVENOUS

## 2019-04-18 NOTE — Plan of Care (Signed)

## 2019-04-18 NOTE — Progress Notes (Signed)
Patient ID: Renee Sanchez, female   DOB: 1991-03-24, 28 y.o.   MRN: [id number] Pt just getting up to floor and contractions more intense so requesting epidural  afeb VSS FHR category 1  Cervix 70/4/-2 AROM of bulging forebag with copious fluid return  Epidural then will augment with pitocin as needed D/w parents possibility of NICU stay for 36 week baby

## 2019-04-18 NOTE — H&P (Signed)
Renee Sanchez is a 28 y.o. female [phone] at 32 2/7 weeks (EDD 05/14/19 by 6 week [place])  presenting for SROM and painful contractions she rates a 6/10.  Unclear how long she has been ruptured, possibly leaking since yesterday.  Prenatal care significant for threatened PTL-received BMZ 8-31 and 9-1, +FFN.  She also has a h/o recurrent SAB.    OB History    Gravida  4   Para  1   Term  1   Preterm      AB  2   Living  1     SAB  2   TAB      Ectopic      Multiple  0   Live Births  1         08-25-2014, 38.1 wks 1. F, 6lbs 15oz, NSVD  SAB x 4  Past Medical History:  Diagnosis Date  . Anemia   . Anxiety   . Depression   . GERD (gastroesophageal reflux disease)   . History of kidney stones   . Normal intrauterine pregnancy in third trimester 08/25/2014  . Retained products of conception, early pregnancy 01/17/2017   Past Surgical History:  Procedure Laterality Date  . DILATION AND CURETTAGE OF UTERUS  01/14/2017  . kidney stent    . kidney stent removal and removal of kidney stone     Family History: family history includes Breast cancer in an other family member. Social History:  reports that she has quit smoking. She has never used smokeless tobacco. She reports current alcohol use. She reports that she does not use drugs.     Maternal Diabetes: No Genetic Screening: Declined Maternal Ultrasounds/Referrals: Normal Fetal Ultrasounds or other Referrals:  None Maternal Substance Abuse:  No Significant Maternal Medications:  None Significant Maternal Lab Results:  None Other Comments:  None  Review of Systems  Constitutional: Negative for fever.  Gastrointestinal: Positive for abdominal pain.   Maternal Medical History:  Reason for admission: Rupture of membranes.   Contractions: Onset was 6-12 hours ago.   Frequency: regular.   Perceived severity is moderate.    Fetal activity: Perceived fetal activity is normal.    Prenatal complications: Preterm  labor.   PTL with +FFN  Prenatal Complications - Diabetes: none.    Dilation: 3.5 Effacement (%): 50 Station: -3 Exam by:: Erasmo Score RN Blood pressure 111/71, pulse 88, temperature 98.4 F (36.9 C), temperature source Oral, resp. rate 18, height 5\' 3"  (1.6 m), weight 80.5 kg, SpO2 97 %, unknown if currently breastfeeding. Maternal Exam:  Uterine Assessment: Contraction strength is moderate.  Contraction frequency is regular.   Abdomen: Patient reports no abdominal tenderness. Fetal presentation: vertex  Introitus: Normal vulva. Normal vagina.  Ferning test: positive.   Pelvis: adequate for delivery.      Physical Exam  Constitutional: She appears well-developed.  Cardiovascular: Normal rate and regular rhythm.  GI: Soft.  Genitourinary:    Vulva normal.   Neurological: She is alert.  Psychiatric: She has a normal mood and affect.    Prenatal labs: ABO, Rh:  A positive Antibody:  neg Rubella:  Non-immune RPR:   NR HBsAg:   Neg HIV:   NR GBS:   Neg One hour GCT 149 Three hour GTT 81, 105, 96, 99  Assessment/Plan: Pt with latent phase labor and SROM or unknown duration at 36+ weeks Received betamethasone earlier in pregnancy with preterm dilation and +FFN.  Will admit and augment with pitocin as needed.  Epidural as desired.   Logan Bores 04/18/2019, 6:01 PM

## 2019-04-18 NOTE — Anesthesia Preprocedure Evaluation (Signed)
Anesthesia Evaluation  Patient identified by MRN, date of birth, ID band Patient awake    Reviewed: Allergy & Precautions, NPO status , Patient's Chart, lab work & pertinent test results  Airway Mallampati: II  TM Distance: >3 FB Neck ROM: Full    Dental no notable dental hx. (+) Teeth Intact   Pulmonary former smoker,    Pulmonary exam normal breath sounds clear to auscultation       Cardiovascular negative cardio ROS Normal cardiovascular exam Rhythm:Regular Rate:Normal     Neuro/Psych negative neurological ROS  negative psych ROS   GI/Hepatic Neg liver ROS, GERD  Medicated and Controlled,  Endo/Other  negative endocrine ROS  Renal/GU Renal diseaseHx/o renal calculi   negative genitourinary   Musculoskeletal negative musculoskeletal ROS (+)   Abdominal   Peds  Hematology  (+) anemia ,   Anesthesia Other Findings   Reproductive/Obstetrics (+) Pregnancy                             Anesthesia Physical  Anesthesia Plan  ASA: II  Anesthesia Plan: Epidural   Post-op Pain Management:    Induction:   PONV Risk Score and Plan:   Airway Management Planned: Natural Airway  Additional Equipment:   Intra-op Plan:   Post-operative Plan:   Informed Consent: I have reviewed the patients History and Physical, chart, labs and discussed the procedure including the risks, benefits and alternatives for the proposed anesthesia with the patient or authorized representative who has indicated his/her understanding and acceptance.       Plan Discussed with: Anesthesiologist  Anesthesia Plan Comments:         Anesthesia Quick Evaluation

## 2019-04-18 NOTE — MAU Note (Signed)
Renee Sanchez is a 28 y.o. at [redacted]w[redacted]d here in MAU reporting: has had contractions for almost her whole pregnancy. Yesterday noticed some bloody mucus and this has continued into today. Since 1500 today she has very painful contractions every 5 minutes. +FM. States she had some LOF yesterday, was changing a pad, has not had any LOF today.  Onset of complaint: yesterday  Pain score: 6/10  Vitals:   04/18/19 1718  BP: 111/71  Pulse: 88  Resp: 18  Temp: 98.4 F (36.9 C)  SpO2: 97%     FHT: +FM  Lab orders placed from triage: UA

## 2019-04-18 NOTE — Anesthesia Procedure Notes (Signed)
Epidural Patient location during procedure: OB Start time: 04/18/2019 8:28 PM End time: 04/18/2019 8:41 PM  Staffing Anesthesiologist: Lynda Rainwater, MD Performed: anesthesiologist   Preanesthetic Checklist Completed: patient identified, site marked, surgical consent, pre-op evaluation, timeout performed, IV checked, risks and benefits discussed and monitors and equipment checked  Epidural Patient position: sitting Prep: ChloraPrep Patient monitoring: heart rate, cardiac monitor, continuous pulse ox and blood pressure Approach: midline Location: L2-L3 Injection technique: LOR saline  Needle:  Needle type: Tuohy  Needle gauge: 17 G Needle length: 9 cm Needle insertion depth: 4 cm Catheter type: closed end flexible Catheter size: 20 Guage Catheter at skin depth: 8 cm Test dose: negative  Assessment Events: blood not aspirated, injection not painful, no injection resistance, negative IV test and no paresthesia  Additional Notes Reason for block:procedure for pain

## 2019-04-19 ENCOUNTER — Encounter (HOSPITAL_COMMUNITY): Payer: Self-pay

## 2019-04-19 LAB — CBC
HCT: 28.3 % — ABNORMAL LOW (ref 36.0–46.0)
Hemoglobin: 9.4 g/dL — ABNORMAL LOW (ref 12.0–15.0)
MCH: 28.6 pg (ref 26.0–34.0)
MCHC: 33.2 g/dL (ref 30.0–36.0)
MCV: 86 fL (ref 80.0–100.0)
Platelets: 187 10*3/uL (ref 150–400)
RBC: 3.29 MIL/uL — ABNORMAL LOW (ref 3.87–5.11)
RDW: 14 % (ref 11.5–15.5)
WBC: 11.7 10*3/uL — ABNORMAL HIGH (ref 4.0–10.5)
nRBC: 0 % (ref 0.0–0.2)

## 2019-04-19 MED ORDER — DIBUCAINE (PERIANAL) 1 % EX OINT: 1.0000 "application " | TOPICAL_OINTMENT | CUTANEOUS | Status: DC | PRN

## 2019-04-19 MED ORDER — AMMONIA AROMATIC IN INHA
RESPIRATORY_TRACT | Status: AC
  Filled 2019-04-19: qty 10

## 2019-04-19 MED ORDER — SIMETHICONE 80 MG PO CHEW: 80.0000 mg | CHEWABLE_TABLET | ORAL | Status: DC | PRN

## 2019-04-19 MED ORDER — DIPHENHYDRAMINE HCL 25 MG PO CAPS: 25.0000 mg | ORAL_CAPSULE | Freq: Four times a day (QID) | ORAL | Status: DC | PRN

## 2019-04-19 MED ORDER — ACETAMINOPHEN 325 MG PO TABS
650.0000 mg | ORAL_TABLET | ORAL | Status: DC | PRN
  Administered 2019-04-19 – 2019-04-21 (×9): 650 mg via ORAL
  Filled 2019-04-19 (×9): qty 2

## 2019-04-19 MED ORDER — WITCH HAZEL-GLYCERIN EX PADS: 1.0000 "application " | MEDICATED_PAD | CUTANEOUS | Status: DC | PRN

## 2019-04-19 MED ORDER — COCONUT OIL OIL: 1.0000 "application " | TOPICAL_OIL | Status: DC | PRN

## 2019-04-19 MED ORDER — ONDANSETRON HCL 4 MG PO TABS: 4.0000 mg | ORAL_TABLET | ORAL | Status: DC | PRN

## 2019-04-19 MED ORDER — SENNOSIDES-DOCUSATE SODIUM 8.6-50 MG PO TABS
2.0000 | ORAL_TABLET | ORAL | Status: DC
  Administered 2019-04-19 – 2019-04-20 (×2): 2 via ORAL
  Filled 2019-04-19 (×2): qty 2

## 2019-04-19 MED ORDER — ZOLPIDEM TARTRATE 5 MG PO TABS: 5.0000 mg | ORAL_TABLET | Freq: Every evening | ORAL | Status: DC | PRN

## 2019-04-19 MED ORDER — TETANUS-DIPHTH-ACELL PERTUSSIS 5-2.5-18.5 LF-MCG/0.5 IM SUSP: 0.5000 mL | Freq: Once | INTRAMUSCULAR | Status: DC

## 2019-04-19 MED ORDER — BENZOCAINE-MENTHOL 20-0.5 % EX AERO
1.0000 "application " | INHALATION_SPRAY | CUTANEOUS | Status: DC | PRN
  Administered 2019-04-19: 1 via TOPICAL
  Filled 2019-04-19 (×2): qty 56

## 2019-04-19 MED ORDER — LACTATED RINGERS AMNIOINFUSION
INTRAVENOUS | Status: DC
  Administered 2019-04-19: 01:00:00 via INTRAUTERINE

## 2019-04-19 MED ORDER — IBUPROFEN 600 MG PO TABS
600.0000 mg | ORAL_TABLET | Freq: Four times a day (QID) | ORAL | Status: DC
  Administered 2019-04-19 – 2019-04-21 (×9): 600 mg via ORAL
  Filled 2019-04-19 (×9): qty 1

## 2019-04-19 MED ORDER — PRENATAL MULTIVITAMIN CH
1.0000 | ORAL_TABLET | Freq: Every day | ORAL | Status: DC
  Administered 2019-04-19 – 2019-04-20 (×2): 1 via ORAL
  Filled 2019-04-19 (×2): qty 1

## 2019-04-19 MED ORDER — ONDANSETRON HCL 4 MG/2ML IJ SOLN: 4.0000 mg | INTRAMUSCULAR | Status: DC | PRN

## 2019-04-19 NOTE — Anesthesia Postprocedure Evaluation (Signed)
Anesthesia Post Note  Patient: Renee Sanchez  Procedure(s) Performed: AN AD HOC LABOR EPIDURAL     Patient location during evaluation: Mother Baby Anesthesia Type: Epidural Level of consciousness: awake and alert and oriented Pain management: satisfactory to patient Vital Signs Assessment: post-procedure vital signs reviewed and stable Respiratory status: spontaneous breathing and nonlabored ventilation Cardiovascular status: stable Postop Assessment: no headache, no backache, no signs of nausea or vomiting, adequate PO intake, patient able to bend at knees and able to ambulate (patient up walking) Anesthetic complications: no    Last Vitals:  Vitals:   04/19/19 0545 04/19/19 0959  BP: 111/69 112/73  Pulse: (!) 101 92  Resp: 18 17  Temp: 36.8 C 36.7 C  SpO2:      Last Pain:  Vitals:   04/19/19 0959  TempSrc: Oral  PainSc:    Pain Goal: Patients Stated Pain Goal: 4 (04/19/19 0951)              Epidural/Spinal Function Cutaneous sensation: Normal sensation (04/19/19 0951)  Willa Rough

## 2019-04-19 NOTE — Progress Notes (Signed)
CSW received consult for MOB due to history of anxiety. CSW met with MOB and FOB at bedside to complete discussion. CSW obtained permission from MOB to speak with FOB present. The parent's informed CSW that they have named their baby Roxy Manns. MOB reports she has had a stable mood since delivery about eight hours ago. MOB reports her anxiety is generalized. MOB denies any baby blues or postpartum depression in the past. MOB denies any questions or concerns at this time.  Madilyn Fireman, MSW, LCSW-A Clinical Social Worker   [facility]   Medical ICU [phone]

## 2019-04-19 NOTE — Plan of Care (Signed)
  Problem: Health Behavior/Discharge Planning: Goal: Ability to manage health-related needs will improve 04/19/2019 0553 by Laruth Bouchard, RN Outcome: Progressing 04/18/2019 1939 by Laruth Bouchard, RN Outcome: Progressing   Problem: Clinical Measurements: Goal: Ability to maintain clinical measurements within normal limits will improve 04/19/2019 0553 by Laruth Bouchard, RN Outcome: Progressing 04/18/2019 1939 by Laruth Bouchard, RN Outcome: Progressing Goal: Will remain free from infection 04/19/2019 0553 by Laruth Bouchard, RN Outcome: Progressing 04/18/2019 1939 by Laruth Bouchard, RN Outcome: Progressing Goal: Diagnostic test results will improve 04/19/2019 0553 by Laruth Bouchard, RN Outcome: Progressing 04/18/2019 1939 by Laruth Bouchard, RN Outcome: Progressing Goal: Respiratory complications will improve 04/19/2019 0553 by Laruth Bouchard, RN Outcome: Progressing 04/18/2019 1939 by Laruth Bouchard, RN Outcome: Progressing Goal: Cardiovascular complication will be avoided 04/19/2019 0553 by Laruth Bouchard, RN Outcome: Progressing 04/18/2019 1939 by Laruth Bouchard, RN Outcome: Progressing   Problem: Activity: Goal: Risk for activity intolerance will decrease 04/19/2019 0553 by Laruth Bouchard, RN Outcome: Progressing 04/18/2019 1939 by Laruth Bouchard, RN Outcome: Progressing   Problem: Nutrition: Goal: Adequate nutrition will be maintained 04/19/2019 0553 by Laruth Bouchard, RN Outcome: Progressing 04/18/2019 1939 by Laruth Bouchard, RN Outcome: Progressing   Problem: Elimination: Goal: Will not experience complications related to bowel motility 04/19/2019 0553 by Laruth Bouchard, RN Outcome: Progressing 04/18/2019 1939 by Laruth Bouchard, RN Outcome: Progressing Goal: Will not experience complications related to urinary retention 04/19/2019 0553 by Laruth Bouchard, RN Outcome: Progressing 04/18/2019 1939 by Laruth Bouchard, RN Outcome: Progressing   Problem: Skin Integrity: Goal: Risk for impaired skin integrity will decrease 04/19/2019 0553 by Laruth Bouchard, RN Outcome: Progressing 04/18/2019 1939 by Laruth Bouchard, RN Outcome: Progressing

## 2019-04-19 NOTE — Progress Notes (Signed)
Patient ID: Renee Sanchez, female   DOB: 1991/05/09, 28 y.o.   MRN: [id number] Pt received epidural and continued to progress slowly so an IUPC placed and pitocin started at 36mu.    FHR over last 40 minutes has developed deeper more persistent variables to the 80-90's at times  Cervix at 8cm/80 per RN exam  Will try an amnioinfusion and hopefully nearing delivery.  Still with good variability and recovery to baseline.

## 2019-04-19 NOTE — Progress Notes (Signed)
Pt up to bathroom via [place], voided x1. Upon moving back to wheelchair pt became nauseous and dizzy. Back to bed VSS, pt states she thinks it is because she needs to eat and is exhausted. Requested Sprite at this time, given and pt tolerated well. Pt states she feels better after drinking fluids, will continue to monitor and transport to [facility].

## 2019-04-19 NOTE — Progress Notes (Signed)
Patient ID: Renee Sanchez, female   DOB: 08-05-1990, 28 y.o.   MRN: [id number] DOD Pt doing well.  Some cramping but no heavy bleeding, due for medications  afeb VSS Fundus firm  Continue care. Baby not currently on oxygen, had some low BS--left nares unable to pass NG tube so will be f/u today

## 2019-04-19 NOTE — Lactation Note (Signed)
This note was copied from a baby's chart. Lactation Consultation Note:  P2, infant is 36.3 weeks CGA, infant is now 23 hours old.    Mother reports that she breastfed her first child for 3 months and then pumped for 1.5 months .   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 She is undecided which pump she wants. LC informed mother to check out the [employer] and informed mother of available pumps.   Mother has the Symphony pump sat up . She was pumping when I arrived in the room. Mother also has her old harmony hand pump. She has been using hand pump as well. She has pumped approx 10-15 ml at this time.   Infant placed in mothers arms at the bedside. Discussed using good support for infant. Infant licked and nuzzled at the breast for 10 mins , no sustained latch. Mother continued STS.   Mother plans to pump every 2-3 hours for 20 mins.  Mother advised in collection, cleaning of pump parts , storage and transportation.  Mother was given Breastfeeding Your NICU baby and Lactation Brochure.  Mother is aware of available [place] services at [facility].   Patient Name: Renee Sanchez [id number] Date: 04/19/2019 Reason for consult: Initial assessment;NICU baby;Late-preterm 34-36.6wks   Maternal Data    Feeding Feeding Type: Breast Fed  LATCH Score                   Interventions Interventions: Breast feeding basics reviewed;Assisted with latch;Skin to skin;Hand express;Adjust position;Support pillows;Position options;Expressed milk;Hand pump;DEBP  Lactation Tools Discussed/Used     Consult Status Consult Status: Follow-up Date: 04/20/19 Follow-up type: In-patient    Jess Barters [facility] 04/19/2019, 3:22 PM

## 2019-04-20 NOTE — Progress Notes (Signed)
POSTPARTUM PROGRESS NOTE  Post Partum Day #1  Subjective:  No acute events overnight.  Pt denies problems with ambulating, voiding or po intake.  She denies nausea or vomiting.  Pain is well controlled.   Lochia Small. Has been umping for baby, has been seen by lactation, occ latch. Nasal patency confirmed; baby's BG being monitore.d Aware that circ will be determined by NICU  Objective: Blood pressure 118/81, pulse 72, temperature 97.7 F (36.5 C), temperature source Oral, resp. rate 18, height 5\' 3"  (1.6 m), weight 80.5 kg, SpO2 99 %, unknown if currently breastfeeding.  Physical Exam:  General: alert, cooperative and no distress Lochia:normal flow Chest: CTAB Heart: RRR no m/r/g Abdomen: +BS, soft, nontender Uterine Fundus: firm, 2cm below umbilicus Extremities: neg edema, neg calf TTP BL, neg Homans BL  Recent Labs    04/18/19 1820 04/19/19 1051  HGB 11.4* 9.4*  HCT 33.6* 28.3*    Assessment/Plan:  ASSESSMENT: Renee Sanchez is a 28 y.o. [id number] s/p SVD @ [redacted]w[redacted]d. [place] c/b BMTZ course on 8/31 for threatened PTL, h/o rec SAB. Overall uncomplicated.   Plan for discharge tomorrow, Breastfeeding, Lactation consult and Circumcision prior to discharge   LOS: 2 days

## 2019-04-20 NOTE — Lactation Note (Signed)
This note was copied from a baby's chart. Lactation Consultation Note  Patient Name: Renee Sanchez [id number] Date: 04/20/2019 Reason for consult: Follow-up assessment;NICU baby Renee Sanchez is 43 hours old in the NICU.  Mom is tearful and concerned about communication on how infant is doing and the plan for feeding.  Mom is pumping every 3 hours and obtaining small amounts of colostrum.  Colostrum is being stored in milk bank.  Mom upset that baby is not receiving colostrum.  She has attempted to put baby to breast this morning but gavage feeding was running so baby wasn't hungry.  Mom is also concerned about baby not latching.  Reviewed late preterm feeding behavior.  I spoke with baby's nurse regarding mom's concerns.  She called NNP to come talk to mom.  Maternal Data    Feeding Feeding Type: Donor Breast Milk  LATCH Score Latch: Too sleepy or reluctant, no latch achieved, no sucking elicited.  Audible Swallowing: None  Type of Nipple: Everted at rest and after stimulation  Comfort (Breast/Nipple): Soft / non-tender  Hold (Positioning): Assistance needed to correctly position infant at breast and maintain latch.  LATCH Score: 5  Interventions Interventions: Support pillows  Lactation Tools Discussed/Used     Consult Status Consult Status: Follow-up Date: 04/21/19 Follow-up type: In-patient    Renee Sanchez Filter 04/20/2019, 11:55 AM

## 2019-04-20 NOTE — Lactation Note (Signed)
This note was copied from a baby's chart. Lactation Consultation Note  Patient Name: Boy Zulay Chapo [id number] Date: 04/20/2019 Reason for consult: Follow-up assessment;NICU baby Mom positioned baby to breast in cross cradle hold.  Nipples semi flat.  Baby opened wide and latched well,  Observed baby actively feed.  Mom is pleased. NNP in to review plan of care and answer questions.  Maternal Data    Feeding Feeding Type: Breast Fed  LATCH Score Latch: Grasps breast easily, tongue down, lips flanged, rhythmical sucking.  Audible Swallowing: A few with stimulation  Type of Nipple: Everted at rest and after stimulation(short)  Comfort (Breast/Nipple): Soft / non-tender  Hold (Positioning): No assistance needed to correctly position infant at breast.  LATCH Score: 9  Interventions Interventions: Support pillows  Lactation Tools Discussed/Used     Consult Status Consult Status: Follow-up Date: 04/21/19 Follow-up type: In-patient    Ave Filter 04/20/2019, 12:38 PM

## 2019-04-21 LAB — RPR: RPR Ser Ql: NONREACTIVE — AB

## 2019-04-21 MED ORDER — IBUPROFEN 600 MG PO TABS: 600.0000 mg | ORAL_TABLET | Freq: Four times a day (QID) | ORAL | 1 refills | Status: DC | PRN

## 2019-04-21 MED ORDER — PRENATAL MULTIVITAMIN CH: 1.0000 | ORAL_TABLET | Freq: Every day | ORAL | 3 refills | Status: DC

## 2019-04-21 MED FILL — IBUPROFEN 600 MG TABLET: 600 | 12 days supply | Qty: 45 | Fill #0

## 2019-04-21 NOTE — Discharge Summary (Signed)
OB Discharge Summary     Patient Name: Renee Sanchez DOB: October 24, 1990 MRN: [id number]  Date of admission: 04/18/2019 Delivering MD: Paula Compton   Date of discharge: 04/21/2019  Admitting diagnosis: CTX 5 mins Intrauterine pregnancy: [redacted]w[redacted]d     Secondary diagnosis:  Active Problems:   Indication for care in labor and delivery, antepartum   NSVD (normal spontaneous vaginal delivery)  Additional problems: Preterm dilation, received BMZ, PTD     Discharge diagnosis: Preterm Pregnancy Delivered                                                                                                Post partum procedures:N/A  Augmentation: Pitocin  Complications: None  Hospital course:  Onset of Labor With Vaginal Delivery     28 y.o. yo [id number] at [redacted]w[redacted]d was admitted in Latent Labor on 04/18/2019. Patient had an uncomplicated labor course as follows:  Membrane Rupture Time/Date: 8:16 PM ,04/18/2019   Intrapartum Procedures: Episiotomy: None [1]                                         Lacerations:    Patient had a delivery of a Viable infant. 04/19/2019  Information for the patient's newborn:  Merredith, Stano [id number]  Delivery Method: Vaginal, Spontaneous(Filed from Delivery Summary)     Pateint had an uncomplicated postpartum course.  She is ambulating, tolerating a regular diet, passing flatus, and urinating well. Patient is discharged home in stable condition on 04/21/19.   Physical exam  Vitals:   04/20/19 0646 04/20/19 1435 04/20/19 2304 04/21/19 0538  BP: 118/81 116/67 107/68 107/64  Pulse: 72 72 68 65  Resp: 18 18 18 18   Temp: 97.7 F (36.5 C) 98 F (36.7 C) 97.9 F (36.6 C) 98.1 F (36.7 C)  TempSrc: Oral Oral Oral Oral  SpO2: 99% 98%    Weight:      Height:       General: alert and no distress Lochia: appropriate Uterine Fundus: firm  Labs: Lab Results  Component Value Date   WBC 11.7 (H) 04/19/2019   HGB 9.4 (L) 04/19/2019   HCT 28.3 (L) 04/19/2019    MCV 86.0 04/19/2019   PLT 187 04/19/2019   CMP Latest Ref Rng & Units 02/22/2014  Glucose 70 - 99 mg/dL 90  BUN 6 - 23 mg/dL 8  Creatinine 0.50 - 1.10 mg/dL 0.66  Sodium 137 - 147 mEq/L 135(L)  Potassium 3.7 - 5.3 mEq/L 3.3(L)  Chloride 96 - 112 mEq/L 101  CO2 19 - 32 mEq/L 22  Calcium 8.4 - 10.5 mg/dL 9.3  Total Protein 6.0 - 8.3 g/dL 6.1  Total Bilirubin 0.3 - 1.2 mg/dL 0.4  Alkaline Phos 39 - 117 U/L 44  AST 0 - 37 U/L 20  ALT 0 - 35 U/L 10    Discharge instruction: per After Visit Summary and "Baby and Me Booklet".  After visit meds:  Allergies as of 04/21/2019   No Known Allergies  Medication List    TAKE these medications   acetaminophen 500 MG tablet Commonly known as: TYLENOL Take 500 mg by mouth every 6 (six) hours as needed for mild pain.   cyclobenzaprine 10 MG tablet Commonly known as: FLEXERIL Take 10 mg by mouth 3 (three) times daily as needed for muscle spasms.   ibuprofen 600 MG tablet Commonly known as: ADVIL Take 1 tablet (600 mg total) by mouth every 6 (six) hours as needed.   prenatal multivitamin Tabs tablet Take 1 tablet by mouth daily at 12 noon.       Diet: routine diet  Activity: Advance as tolerated. Pelvic rest for 6 weeks.   Outpatient follow up:6 weeks Follow up Appt:No future appointments. Follow up Visit:No follow-ups on file.  Postpartum contraception: Undecided  Newborn Data: Live born female  Birth Weight: 6 lb 12.8 oz (3084 g) APGAR: 7, 9  Newborn Delivery   Birth date/time: 04/19/2019 02:04:00 Delivery type: Vaginal, Spontaneous      Baby Feeding: Breast Disposition:NICU   04/21/2019 Janyth Contes, MD

## 2019-04-21 NOTE — Lactation Note (Signed)
This note was copied from a baby's chart. Lactation Consultation Note  Patient Name: Renee Sanchez [id number] Date: 04/21/2019  [name] is 26 hours old in the NICU.  Baby is receiving phototherapy.  Mom attempting to breastfeed baby but her is too sleepy.  Reassured.  Mom is pumping every 3 hours and milk is coming to volume,  Discussed prevention and treatment of engorgement.  Mom provided with Medela pump in style. Encouraged to call for assist or concerns.   Maternal Data    Feeding    LATCH Score                   Interventions    Lactation Tools Discussed/Used     Consult Status      Ave Filter 04/21/2019, 9:12 AM

## 2019-04-21 NOTE — Progress Notes (Signed)
Post Partum Day 2 Subjective: no complaints, up ad lib, voiding, tolerating PO and nl lochia, pain controlled  Objective: Blood pressure 107/64, pulse 65, temperature 98.1 F (36.7 C), temperature source Oral, resp. rate 18, height 5\' 3"  (1.6 m), weight 80.5 kg, SpO2 98 %, unknown if currently breastfeeding.  Physical Exam:  General: alert and no distress Lochia: appropriate Uterine Fundus: firm   Recent Labs    04/18/19 1820 04/19/19 1051  HGB 11.4* 9.4*  HCT 33.6* 28.3*    Assessment/Plan: Discharge home, Breastfeeding and Lactation consult.  Routine PP care.  D/C with Motrin and PNV.  F/u 6 weeks.     LOS: 3 days   Renee Sanchez 04/21/2019, 7:09 AM

## 2019-04-22 ENCOUNTER — Ambulatory Visit: Payer: Self-pay

## 2019-04-22 NOTE — Lactation Note (Signed)
This note was copied from a baby's chart. Lactation Consultation Note  Patient Name: Renee Sanchez [id number] Date: 04/22/2019 Reason for consult: Follow-up assessment;Late-preterm 34-36.6wks;NICU baby  Visited with mother in couplet care.  This is a LPTI at 36+3 weeks with a CGA of 36+6 weeks.  Baby remains under phototherapy and bilirubin levels have risen slightly.  Mother has been pleased with her milk supply.  She is obtaining more EBM today and continues to do hand expression before/after pumping.  She does try to breast feed also but stated that baby gets very sleepy.  Reassured her that this is typical behavior for a LPTI, especially since he is receiving phototherapy.  Informed her that he will begin to stay awake more when phototherapy is discontinued and as he grows.  Mother verbalized understanding.  She was happy to report that "all his other issues" have resolved.    Mother has received her [employer] employee pump.  Encouraged a lot of STS and for mother to call for latch assistance as needed.  Father present.  RN in room.     Maternal Data    Feeding Feeding Type: Breast Milk  LATCH Score Latch: Grasps breast easily, tongue down, lips flanged, rhythmical sucking.  Audible Swallowing: Spontaneous and intermittent  Type of Nipple: Everted at rest and after stimulation  Comfort (Breast/Nipple): Soft / non-tender  Hold (Positioning): No assistance needed to correctly position infant at breast.  LATCH Score: 10  Interventions    Lactation Tools Discussed/Used     Consult Status Consult Status: Follow-up Date: 04/23/19 Follow-up type: In-patient    Selisa Tensley R Darchelle Nunes 04/22/2019, 8:46 AM

## 2019-04-23 ENCOUNTER — Ambulatory Visit: Payer: Self-pay

## 2019-04-23 NOTE — Lactation Note (Signed)
This note was copied from a baby's chart. Lactation Consultation Note  Patient Name: Renee Sanchez [id number] Date: 04/23/2019 Reason for consult: Follow-up assessment;NICU baby;Late-preterm 34-36.6wks  LC in to visit with P2 Mom of LPTI in the NICU.  Baby is 45 days old, at 4.8% weight loss, phototherapy dc'd and baby had circumcision this am.  Mom is pumping often and has an abundant milk supply (4-5 oz per pumping).  Baby cueing and fussing.  Offered to assist with positioning and latching, Mom declined.  Mom's goals are to pump and bottle feed baby her EBM and after she gets home, she will work on transitioning to the breast.    Encouraged as much STS as possible, offering breast when baby cues.    Follow with double pumping and supplementing, to support her milk supply.    Talked about the benefits of seeing our OP lactation consultant next week.  Explained how the appointment would go.  Phone numbers for making an appointment (Mom declined OP request currently) and talking with [facility] written on dry erase board.    Reviewed importance of washing, rinsing, and air drying pump parts away from sink.  Moved pump parts to basin away from sink.  Mom denies having any questions currently.    Consult Status Consult Status: Follow-up Date: 04/24/19 Follow-up type: In-patient    Broadus John 04/23/2019, 1:33 PM

## 2019-05-14 ENCOUNTER — Inpatient Hospital Stay (HOSPITAL_COMMUNITY): Admit: 2019-05-14 | Payer: Self-pay

## 2019-07-14 MED FILL — METOCLOPRAMIDE 10 MG TABLET: 10 | 20 days supply | Qty: 60 | Fill #0

## 2020-01-11 MED FILL — EPIDUO FORTE 0.3-2.5% GEL P: 0.3-2.5 | 30 days supply | Qty: 45 | Fill #0

## 2020-05-02 ENCOUNTER — Encounter: Payer: Self-pay | Admitting: Licensed Clinical Social Worker

## 2020-05-05 ENCOUNTER — Inpatient Hospital Stay: Payer: No Typology Code available for payment source | Attending: Oncology | Admitting: Licensed Clinical Social Worker

## 2020-05-05 ENCOUNTER — Inpatient Hospital Stay: Payer: No Typology Code available for payment source

## 2020-05-05 ENCOUNTER — Other Ambulatory Visit: Payer: Self-pay

## 2020-05-05 ENCOUNTER — Encounter: Payer: Self-pay | Admitting: Licensed Clinical Social Worker

## 2020-05-05 DIAGNOSIS — Z800 Family history of malignant neoplasm of digestive organs: Secondary | ICD-10-CM | POA: Diagnosis not present

## 2020-05-05 DIAGNOSIS — Z8041 Family history of malignant neoplasm of ovary: Secondary | ICD-10-CM

## 2020-05-05 DIAGNOSIS — Z803 Family history of malignant neoplasm of breast: Secondary | ICD-10-CM | POA: Diagnosis not present

## 2020-05-05 NOTE — Progress Notes (Signed)
REFERRING PROVIDER: Maurice Small, MD [address],  [place] [address]  PRIMARY PROVIDER:  Maurice Small, MD  PRIMARY REASON FOR VISIT:  1. Family history of breast cancer   2. Family history of pancreatic cancer   3. Family history of ovarian cancer   4. Family history of colon cancer      HISTORY OF PRESENT ILLNESS:   [name], a 29 y.o. female, was seen for a [facility] cancer genetics consultation at the request of Dr. Justin Mend due to a family history of cancer.  Renee Sanchez presents to clinic today to discuss the possibility of a hereditary predisposition to cancer, genetic testing, and to further clarify her future cancer risks, as well as potential cancer risks for family members.   Renee Sanchez is a 30 y.o. female with no personal history of cancer.    CANCER HISTORY:  Oncology History   No history exists.     RISK FACTORS:  Menarche was at age 12.  First live birth at age 15.  OCP use for short time.  Ovaries intact: yes.  Hysterectomy: no.  Menopausal status: premenopausal.  HRT use: 0 years. Colonoscopy: no; not examined. Mammogram within the last year: no. Number of breast biopsies: 0. Up to date with pelvic exams: yes. Any excessive radiation exposure in the past: no  Past Medical History:  Diagnosis Date  . Anemia   . Anxiety   . Depression   . Family history of breast cancer   . Family history of colon cancer   . Family history of ovarian cancer   . Family history of pancreatic cancer   . GERD (gastroesophageal reflux disease)   . History of kidney stones   . Normal intrauterine pregnancy in third trimester 08/25/2014  . Retained products of conception, early pregnancy 01/17/2017    Past Surgical History:  Procedure Laterality Date  . DILATION AND CURETTAGE OF UTERUS  01/14/2017  . kidney stent    . kidney stent removal and removal of kidney stone      Social History   Socioeconomic History  . Marital status: Married     Spouse name: Lovena Le  . Number of children: Not on file  . Years of education: Not on file  . Highest education level: Not on file  Occupational History  . Not on file  Tobacco Use  . Smoking status: Former Research scientist (life sciences)  . Smokeless tobacco: Never Used  Substance and Sexual Activity  . Alcohol use: Not Currently    Comment: social  . Drug use: No  . Sexual activity: Yes    Birth control/protection: None  Other Topics Concern  . Not on file  Social History Narrative  . Not on file   Social Determinants of Health   Financial Resource Strain:   . Difficulty of Paying Living Expenses: Not on file  Food Insecurity:   . Worried About Charity fundraiser in the Last Year: Not on file  . Ran Out of Food in the Last Year: Not on file  Transportation Needs:   . Lack of Transportation (Medical): Not on file  . Lack of Transportation (Non-Medical): Not on file  Physical Activity:   . Days of Exercise per Week: Not on file  . Minutes of Exercise per Session: Not on file  Stress:   . Feeling of Stress : Not on file  Social Connections:   . Frequency of Communication with Friends and Family: Not on file  .  Frequency of Social Gatherings with Friends and Family: Not on file  . Attends Religious Services: Not on file  . Active Member of Clubs or Organizations: Not on file  . Attends Archivist Meetings: Not on file  . Marital Status: Not on file     FAMILY HISTORY:  We obtained a detailed, 4-generation family history.  Significant diagnoses are listed below: Family History  Problem Relation Age of Onset  . Varicose Veins Mother   . ADD / ADHD Father   . Alcohol abuse Father   . Drug abuse Father   . Anxiety disorder Father   . Depression Father   . ADD / ADHD Brother   . Pancreatic cancer Paternal Uncle 78  . Breast cancer Maternal Grandmother        late 60s-70s  . Arthritis Paternal Grandmother   . COPD Paternal Grandmother   . Diabetes Paternal Grandmother   . Breast  cancer Paternal Grandmother 4  . Cancer Paternal Grandmother        fallopian tube dx 103  . Cancer - Other Paternal Grandmother        tongue cancer dx 47  . Cervical cancer Paternal Aunt        dx 60s, genetic test + for colon gene  . Colon cancer Other   . Colon cancer Paternal Great-grandmother   . Pancreatic cancer Other    Renee Sanchez has a son, 1, and a daughter, 5. She has a brother, 71, no history of cancer.  Renee Sanchez mother is living at 49. Patient has 2 maternal uncles and 1 maternal aunt. No cancers in these individuals or in cousins. Maternal grandmother had breast cancer in her 38s-70s. Grandfather died young of a brain aneurysm.   Renee Sanchez father is living at 74 and does not want genetic testing. Patient has 2 paternal aunts and 1 paternal uncle. Her uncle had pancreatic cancer and died at 59. One of her aunts recently had cervical cancer in her 13s and had genetic testing that was positive for a colon cancer gene Patient will try to obtain copy of this report. No cancers in cousins. Paternal grandfather died in his 21s. Grandmother had tongue cancer at 45, breast cancer at 78 and fallopian tube cancer at 50, she died at 53. She had a brother who had colon cancer, a sister who had pancreatic cancer, and her mother, patient's great grandmother, had colon cancer as well.   Renee Sanchez is aware of previous family history of genetic testing for hereditary cancer risks. Patient's maternal ancestors are of unknown descent, and paternal ancestors are of unknown descent. There is no reported Ashkenazi Jewish ancestry. There is no known consanguinity.  GENETIC COUNSELING ASSESSMENT: Renee Sanchez is a 29 y.o. female with a family history of breast, pancreatic and colon cancer which is somewhat suggestive of a hereditary cancer syndrome and predisposition to cancer. We, therefore, discussed and recommended the following at today's visit.   DISCUSSION: We discussed that approximately  5-10% of breast and pancreatic cancer is hereditary. Most cases of hereditary breast/pancreatic cancer are associated with BRCA1/BRCA2 genes, although there are other genes associated with hereditary cancer as well including the Lynch syndrome genes. There are also other genes associated with colon cancer which is why it will be important to review a copy of her aunt's genetic testing.   We discussed that testing is beneficial for several reasons including  knowing about cancer risks, identifying potential screening and risk-reduction options that  may be appropriate, and to understand if other family members could be at risk for cancer and allow them to undergo genetic testing.   We reviewed the characteristics, features and inheritance patterns of hereditary cancer syndromes. We also discussed genetic testing, including the appropriate family members to test, the process of testing, insurance coverage and turn-around-time for results. We discussed the implications of a negative, positive and/or variant of uncertain significant result. We recommended Ms. Wiesen pursue genetic testing for the [facility] Multi-Cancer gene panel.   Based on Ms. Vizcarrondo's family history of cancer, she meets medical criteria for genetic testing. Despite that she meets criteria, she may still have an out of pocket cost.   We discussed that some people do not want to undergo genetic testing due to fear of genetic discrimination.  A federal law called the Genetic Information Non-Discrimination Act (GINA) of 2008 helps protect individuals against genetic discrimination based on their genetic test results.  It impacts both health insurance and employment.  For health insurance, it protects against increased premiums, being kicked off insurance or being forced to take a test in order to be insured.  For employment it protects against hiring, firing and promoting decisions based on genetic test results.  Health status due to a cancer  diagnosis is not protected under GINA.  This law does not protect life insurance, disability insurance, or other types of insurance.   PLAN: After considering the risks, benefits, and limitations, Ms. Causey provided informed consent to pursue genetic testing and the blood sample was sent to [facility] for analysis of the Multi-Cancer Panel. Results should be available within approximately 2-3 weeks' time, at which point they will be disclosed by telephone to Ms. Gardenhire, as will any additional recommendations warranted by these results. Ms. Picchi will receive a summary of her genetic counseling visit and a copy of her results once available. This information will also be available in Epic.   Ms. Pierron questions were answered to her satisfaction today. Our contact information was provided should additional questions or concerns arise. Thank you for the referral and allowing [place] to share in the care of your patient.   Faith Rogue, MS, [facility] Genetic Counselor [email or web address] Phone: [phone]  The patient was seen for a total of 30 minutes in face-to-face genetic counseling.  Dr. Grayland Ormond was available for discussion regarding this case.   _______________________________________________________________________ For Office Staff:  Number of people involved in session: 1 Was an Intern/ student involved with case: no

## 2020-05-11 ENCOUNTER — Other Ambulatory Visit (HOSPITAL_COMMUNITY): Payer: Self-pay | Admitting: Family Medicine

## 2020-05-11 MED FILL — AMPHETAMINE SALTS 10 MG: 10 | 30 days supply | Qty: 60 | Fill #0

## 2020-05-11 MED FILL — SERTRALINE HCL 50 MG TABLET: 50 | 90 days supply | Qty: 90 | Fill #0

## 2020-05-19 ENCOUNTER — Ambulatory Visit: Payer: Self-pay | Admitting: Licensed Clinical Social Worker

## 2020-05-19 ENCOUNTER — Encounter: Payer: Self-pay | Admitting: Licensed Clinical Social Worker

## 2020-05-19 ENCOUNTER — Telehealth: Payer: Self-pay | Admitting: Licensed Clinical Social Worker

## 2020-05-19 DIAGNOSIS — Z1379 Encounter for other screening for genetic and chromosomal anomalies: Secondary | ICD-10-CM | POA: Insufficient documentation

## 2020-05-19 DIAGNOSIS — Z8041 Family history of malignant neoplasm of ovary: Secondary | ICD-10-CM

## 2020-05-19 DIAGNOSIS — Z803 Family history of malignant neoplasm of breast: Secondary | ICD-10-CM

## 2020-05-19 DIAGNOSIS — Z800 Family history of malignant neoplasm of digestive organs: Secondary | ICD-10-CM

## 2020-05-19 NOTE — Progress Notes (Signed)
HPI:  Renee Sanchez was previously seen in the [facility] clinic due to a family history of cancer and concerns regarding a hereditary predisposition to cancer. Please refer to our prior cancer genetics clinic note for more information regarding our discussion, assessment and recommendations, at the time. Renee Sanchez recent genetic test results were disclosed to her, as were recommendations warranted by these results. These results and recommendations are discussed in more detail below.  CANCER HISTORY:  Oncology History   No history exists.    FAMILY HISTORY:  We obtained a detailed, 4-generation family history.  Significant diagnoses are listed below: Family History  Problem Relation Age of Onset  . Varicose Veins Mother   . ADD / ADHD Father   . Alcohol abuse Father   . Drug abuse Father   . Anxiety disorder Father   . Depression Father   . ADD / ADHD Brother   . Pancreatic cancer Paternal Uncle 43  . Breast cancer Maternal Grandmother        late 60s-70s  . Arthritis Paternal Grandmother   . COPD Paternal Grandmother   . Diabetes Paternal Grandmother   . Breast cancer Paternal Grandmother 69  . Cancer Paternal Grandmother        fallopian tube dx 43  . Cancer - Other Paternal Grandmother        tongue cancer dx 20  . Cervical cancer Paternal Aunt        dx 65s, genetic test + for colon gene  . Colon cancer Other   . Colon cancer Paternal Great-grandmother   . Pancreatic cancer Other     Renee Sanchez has a son, 1, and a daughter, 5. She has a brother, 44, no history of cancer.  Renee Sanchez mother is living at 98. Patient has 2 maternal uncles and 1 maternal aunt. No cancers in these individuals or in cousins. Maternal grandmother had breast cancer in her 53s-70s. Grandfather died young of a brain aneurysm.   Renee Sanchez father is living at 3 and does not want genetic testing. Patient has 2 paternal aunts and 1 paternal uncle. Her uncle had pancreatic  cancer and died at 44. One of her aunts recently had cervical cancer in her 54s and had genetic testing that was positive for a colon cancer gene. Patient will try to obtain copy of this report. Update 10/28: No copy available yet, but patient will call if she obtains copy. No cancers in cousins. Paternal grandfather died in his 35s. Grandmother had tongue cancer at 25, breast cancer at 36 and fallopian tube cancer at 53, she died at 16. She had a brother who had colon cancer, a sister who had pancreatic cancer, and her mother, patient's great grandmother, had colon cancer as well.   Renee Sanchez is aware of previous family history of genetic testing for hereditary cancer risks. Patient's maternal ancestors are of unknown descent, and paternal ancestors are of unknown descent. There is no reported Ashkenazi Jewish ancestry. There is no known consanguinity.  GENETIC TEST RESULTS: Genetic testing reported out on 05/18/2020 through the [facility] Multi- cancer panel found no pathogenic mutations.   The Multi-Cancer Panel offered by [facility] includes sequencing and/or deletion duplication testing of the following 85 genes: AIP, ALK, APC, ATM, AXIN2,BAP1,  BARD1, BLM, BMPR1A, BRCA1, BRCA2, BRIP1, CASR, CDC73, CDH1, CDK4, CDKN1B, CDKN1C, CDKN2A (p14ARF), CDKN2A (p16INK4a), CEBPA, CHEK2, CTNNA1, DICER1, DIS3L2, EGFR (c.2369C>T, p.Thr790Met variant only), EPCAM (Deletion/duplication testing only), FH, FLCN, GATA2, GPC3, GREM1 (Promoter  region deletion/duplication testing only), HOXB13 (c.251G>A, p.Gly84Glu), HRAS, KIT, MAX, MEN1, MET, MITF (c.952G>A, p.Glu318Lys variant only), MLH1, MSH2, MSH3, MSH6, MUTYH, NBN, NF1, NF2, NTHL1, PALB2, PDGFRA, PHOX2B, PMS2, POLD1, POLE, POT1, PRKAR1A, PTCH1, PTEN, RAD50, RAD51C, RAD51D, RB1, RECQL4, RET, RNF43, RUNX1, SDHAF2, SDHA (sequence changes only), SDHB, SDHC, SDHD, SMAD4, SMARCA4, SMARCB1, SMARCE1, STK11, SUFU, TERC, TERT, TMEM127, TP53, TSC1, TSC2, VHL, WRN and WT1. .   The  test report has been scanned into EPIC and is located under the Molecular Pathology section of the Results Review tab.  A portion of the result report is included below for reference.     We discussed with Ms. Netzer that because current genetic testing is not perfect, it is possible there may be a gene mutation in one of these genes that current testing cannot detect, but that chance is small.  We also discussed, that there could be another gene that has not yet been discovered, or that we have not yet tested, that is responsible for the cancer diagnoses in the family. It is also possible there is a hereditary cause for the cancer in the family that Ms. Rafuse did not inherit and therefore was not identified in her testing.  Therefore, it is important to remain in touch with cancer genetics in the future so that we can continue to offer Ms. Forbess the most up to date genetic testing.   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  At this time, it is unknown if this variant is associated with increased cancer risk or if this is a normal finding, but most variants such as this get reclassified to being inconsequential. It should not be used to make medical management decisions. With time, we suspect the lab will determine the significance of this variant, if any. If we do learn more about it, we will try to contact Renee Sanchez to discuss it further. However, it is important to stay in touch with [place] periodically and keep the address and phone number up to date.  ADDITIONAL GENETIC TESTING: We discussed with Renee Sanchez that her genetic testing was fairly extensive.  If there are genes identified to increase cancer risk that can be analyzed in the future, we would be happy to discuss and coordinate this testing at that time.    CANCER SCREENING RECOMMENDATIONS: Renee Sanchez test result is considered negative (normal).  This means that we have  not identified a hereditary cause for her family history of cancer at this time.   While reassuring, this does not definitively rule out a hereditary predisposition to cancer. It is still possible that there could be genetic mutations that are undetectable by current technology. There could be genetic mutations in genes that have not been tested or identified to increase cancer risk.  Therefore, it is recommended she continue to follow the cancer management and screening guidelines provided by her primary healthcare provider.   An individual's cancer risk and medical management are not determined by genetic test results alone. Overall cancer risk assessment incorporates additional factors, including personal medical history, family history, and any available genetic information that may result in a personalized plan for cancer prevention and surveillance.   Based on Renee Sanchez's personal and family history of cancer as well as her genetic test results, risk model Harriett Rush was used to estimate her risk of developing breast cancer. This estimates her lifetime risk of developing breast cancer to be approximately 18%.  The patient's lifetime breast cancer risk is a preliminary estimate based on available information using one of several models endorsed by the [place] (ACS). The ACS recommends consideration of breast MRI screening as an adjunct to mammography for patients at high risk (defined as 20% or greater lifetime risk).     RECOMMENDATIONS FOR FAMILY MEMBERS:  Relatives in this family might be at some increased risk of developing cancer, over the general population risk, simply due to the family history of cancer.  We recommended female relatives in this family have a yearly mammogram beginning at age 23, or 35 years younger than the earliest onset of cancer, an annual clinical breast exam, and perform monthly breast self-exams. Female relatives in this family should also have a  gynecological exam as recommended by their primary provider.  All family members should be referred for colonoscopy starting at age 32.    It is also possible there is a hereditary cause for the cancer in Renee Sanchez's family that she did not inherit and therefore was not identified in her.  Based on Renee Sanchez's family history, we recommended paternal relatives have genetic counseling and testing. Renee Sanchez will let us know if we can be of any assistance in coordinating genetic counseling and/or testing for these family members.  FOLLOW-UP: Lastly, we discussed with Renee Sanchez that cancer genetics is a rapidly advancing field and it is possible that new genetic tests will be appropriate for her and/or her family members in the future. We encouraged her to remain in contact with cancer genetics on an annual basis so we can update her personal and family histories and let her know of advances in cancer genetics that may benefit this family.   Our contact number was provided. Renee Sanchez questions were answered to her satisfaction, and she knows she is welcome to call us at anytime with additional questions or concerns.   Faith Rogue, MS, [facility] Genetic Counselor [email or web address] Phone: [phone]

## 2020-05-19 NOTE — Telephone Encounter (Signed)
Revealed negative genetic testing.  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 This normal result is reassuring.  It is unlikely that there is an increased risk of  cancer due to a mutation in one of these genes.  However, genetic testing is not perfect, and cannot definitively rule out a hereditary cause.  It will be important for her to keep in contact with genetics to learn if any additional testing may be needed in the future.

## 2020-05-31 ENCOUNTER — Other Ambulatory Visit (HOSPITAL_COMMUNITY): Payer: Self-pay | Admitting: Dermatology

## 2020-05-31 MED FILL — EPIDUO FORTE 0.3-2.5% GEL P: 0.3-2.5 | 30 days supply | Qty: 45 | Fill #0

## 2020-06-08 MED FILL — EPIDUO FORTE 0.3-2.5% GEL P: 0.3-2.5 | 30 days supply | Qty: 45 | Fill #0

## 2020-08-05 ENCOUNTER — Other Ambulatory Visit (HOSPITAL_COMMUNITY): Payer: Self-pay | Admitting: Family Medicine

## 2020-08-05 MED FILL — SUMATRIPTAN SUCC 25 MG TAB: 25 | 30 days supply | Qty: 14 | Fill #0

## 2020-08-15 MED FILL — SUMATRIPTAN SUCC 25 MG TAB: 25 | 30 days supply | Qty: 14 | Fill #0

## 2020-11-07 ENCOUNTER — Other Ambulatory Visit (HOSPITAL_COMMUNITY): Payer: Self-pay

## 2020-11-07 MED ORDER — GLUCOSE BLOOD VI STRP
ORAL_STRIP | 0 refills | Status: DC
  Filled 2020-11-07: qty 4, 4d supply, fill #0

## 2020-11-07 MED ORDER — CARESTART COVID-19 HOME TEST VI KIT
PACK | 0 refills | Status: DC
  Filled 2020-11-07: qty 4, 4d supply, fill #0

## 2021-01-30 ENCOUNTER — Other Ambulatory Visit (HOSPITAL_COMMUNITY): Payer: Self-pay

## 2021-01-30 MED ORDER — CARESTART COVID-19 HOME TEST VI KIT
PACK | 0 refills | Status: DC
  Filled 2021-01-30: qty 4, 4d supply, fill #0

## 2021-09-02 ENCOUNTER — Encounter: Payer: Self-pay | Admitting: Emergency Medicine

## 2021-09-02 ENCOUNTER — Emergency Department: Payer: No Typology Code available for payment source

## 2021-09-02 ENCOUNTER — Other Ambulatory Visit: Payer: Self-pay

## 2021-09-02 ENCOUNTER — Emergency Department
Admission: EM | Admit: 2021-09-02 | Discharge: 2021-09-02 | Disposition: A | Discharge status: Home / Self Care | Payer: No Typology Code available for payment source | Attending: Emergency Medicine | Admitting: Emergency Medicine

## 2021-09-02 DIAGNOSIS — R109 Unspecified abdominal pain: Secondary | ICD-10-CM | POA: Diagnosis present

## 2021-09-02 DIAGNOSIS — N201 Calculus of ureter: Secondary | ICD-10-CM | POA: Diagnosis not present

## 2021-09-02 DIAGNOSIS — N200 Calculus of kidney: Secondary | ICD-10-CM

## 2021-09-02 HISTORY — DX: Calculus of kidney: N20.0

## 2021-09-02 LAB — URINALYSIS, ROUTINE W REFLEX MICROSCOPIC
Bacteria, UA: NONE SEEN
Bilirubin Urine: NEGATIVE
Glucose, UA: NEGATIVE mg/dL
Ketones, ur: NEGATIVE mg/dL
Nitrite: NEGATIVE
Protein, ur: NEGATIVE mg/dL
RBC / HPF: >50 RBC/hpf — ABNORMAL HIGH (ref 0–5)
Specific Gravity, Urine: 1.02 (ref 1.005–1.030)
pH: 7 (ref 5.0–8.0)

## 2021-09-02 LAB — CBC
HCT: 40.1 % (ref 36.0–46.0)
Hemoglobin: 13.3 g/dL (ref 12.0–15.0)
MCH: 28.5 pg (ref 26.0–34.0)
MCHC: 33.2 g/dL (ref 30.0–36.0)
MCV: 86.1 fL (ref 80.0–100.0)
Platelets: 259 10*3/uL (ref 150–400)
RBC: 4.66 MIL/uL (ref 3.87–5.11)
RDW: 14 % (ref 11.5–15.5)
WBC: 7.1 10*3/uL (ref 4.0–10.5)
nRBC: 0 % (ref 0.0–0.2)

## 2021-09-02 LAB — COMPREHENSIVE METABOLIC PANEL
ALT: 16 U/L (ref 0–44)
AST: 20 U/L (ref 15–41)
Albumin: 4.2 g/dL (ref 3.5–5.0)
Alkaline Phosphatase: 64 U/L (ref 38–126)
Anion gap: 10 (ref 5–15)
BUN: 14 mg/dL (ref 6–20)
CO2: 22 mmol/L (ref 22–32)
Calcium: 9.4 mg/dL (ref 8.9–10.3)
Chloride: 106 mmol/L (ref 98–111)
Creatinine, Ser: 0.93 mg/dL (ref 0.44–1.00)
GFR, Estimated: >60 mL/min (ref >60)
Glucose, Bld: 139 mg/dL — ABNORMAL HIGH (ref 70–99)
Potassium: 3.3 mmol/L — ABNORMAL LOW (ref 3.5–5.1)
Sodium: 138 mmol/L (ref 135–145)
Total Bilirubin: 0.5 mg/dL (ref 0.3–1.2)
Total Protein: 7.6 g/dL (ref 6.5–8.1)

## 2021-09-02 LAB — POC URINE PREG, ED: Preg Test, Ur: NEGATIVE

## 2021-09-02 MED ORDER — KETOROLAC TROMETHAMINE 30 MG/ML IJ SOLN
30.0000 mg | Freq: Once | INTRAMUSCULAR | Status: AC
  Administered 2021-09-02: 30 mg via INTRAVENOUS
  Filled 2021-09-02: qty 1

## 2021-09-02 MED ORDER — ONDANSETRON HCL 4 MG/2ML IJ SOLN
4.0000 mg | Freq: Once | INTRAMUSCULAR | Status: AC
  Administered 2021-09-02: 4 mg via INTRAVENOUS
  Filled 2021-09-02: qty 2

## 2021-09-02 MED ORDER — FENTANYL CITRATE PF 50 MCG/ML IJ SOSY
50.0000 ug | PREFILLED_SYRINGE | INTRAMUSCULAR | Status: DC | PRN
  Administered 2021-09-02: 50 ug via INTRAVENOUS
  Filled 2021-09-02: qty 1

## 2021-09-02 MED ORDER — ONDANSETRON 4 MG PO TBDP: 4.0000 mg | ORAL_TABLET | Freq: Three times a day (TID) | ORAL | 0 refills | Status: DC | PRN

## 2021-09-02 MED ORDER — TAMSULOSIN HCL 0.4 MG PO CAPS: 0.4000 mg | ORAL_CAPSULE | Freq: Every day | ORAL | 0 refills | Status: DC

## 2021-09-02 MED ORDER — OXYCODONE-ACETAMINOPHEN 5-325 MG PO TABS: 1.0000 | ORAL_TABLET | ORAL | 0 refills | Status: DC | PRN

## 2021-09-02 NOTE — ED Notes (Signed)
Attempted axillary and oral temp multiple times without success. When pt has less hyperventilation will attempt oral temp again.

## 2021-09-02 NOTE — ED Triage Notes (Signed)
Pt with onset of left flank pain pta. Pt states has nausea and vomiting. Pt has a history of renal calculi.

## 2021-09-02 NOTE — Discharge Instructions (Signed)
Follow-up with Dr. Diamantina Providence who is the urologist on-call.  Increase fluids.  3 medications were sent to the pharmacy to help with your kidney stone.  The Percocet is the narcotic for moderate to severe pain.  Do not drive or operate machinery while taking this medication as it could cause drowsiness thank increase your risk for injury.  Zofran is the medicine that was given to you while in the emergency department for nausea and Flomax is once a day starting today with breakfast.  Return to the emergency department over the weekend if any severe worsening of your symptoms such as fever, chills, nausea or vomiting.  Otherwise she may follow-up with the urologist or your PCP.

## 2021-09-02 NOTE — ED Provider Notes (Signed)
[facility] Provider Note    Event Date/Time   First MD Initiated Contact with Patient 09/02/21 [phone]     (approximate)   History   Flank Pain   HPI  Renee Sanchez is a 31 y.o. female   presents to the ED with complaint of left flank pain that started last evening.  Patient states that she was unable to get comfortable last night while sleeping and also became nauseous.  She does have a history of kidney stones in the past.  Her last one was when she was pregnant couple of years ago and in which time she was given Flexeril.  She states she took this and was able to sleep but when she got up this morning she began having increased left flank pain and nausea.  She has vomited once while coming to the emergency department.  She denies any fever, chills or UTI-like symptoms.  Currently she rates her pain as 7 out of 10.      Physical Exam   Triage Vital Signs: ED Triage Vitals  Enc Vitals Group     BP 09/02/21 0637 117/79     Pulse Rate 09/02/21 0637 83     Resp 09/02/21 0637 (!) 22     Temp --      Temp Source 09/02/21 0637 Oral     SpO2 09/02/21 0637 100 %     Weight 09/02/21 0638 180 lb (81.6 kg)     Height 09/02/21 0638 5\' 3"  (1.6 m)     Head Circumference --      Peak Flow --      Pain Score 09/02/21 0637 7     Pain Loc --      Pain Edu? --      Excl. in [place]? --     Most recent vital signs: Vitals:   09/02/21 0637 09/02/21 0818  BP: 117/79 102/65  Pulse: 83 75  Resp: 20 14  SpO2: 100% 100%     General: Awake, no distress.  CV:  Good peripheral perfusion.  Heart regular rate and rhythm without murmur. Resp:  Normal effort.  Lungs are clear bilaterally. Abd:  No distention.  Other:  Patient is ambulatory without any assistance.   ED Results / Procedures / Treatments   Labs (all labs ordered are listed, but only abnormal results are displayed) Labs Reviewed  URINALYSIS, ROUTINE W REFLEX MICROSCOPIC - Abnormal; Notable for the  following components:      Result Value   Color, Urine YELLOW (*)    APPearance HAZY (*)    Hgb urine dipstick LARGE (*)    Leukocytes,Ua TRACE (*)    RBC / HPF >50 (*)    All other components within normal limits  COMPREHENSIVE METABOLIC PANEL - Abnormal; Notable for the following components:   Potassium 3.3 (*)    Glucose, Bld 139 (*)    All other components within normal limits  CBC  POC URINE PREG, ED      RADIOLOGY  CT renal stone study was reviewed by myself.  Radiology report shows a 3 mm left pelvic sidewall calcification likely a distal urethral calculus with no hydronephrosis.  There is a punctate lower pole left renal calculus.     PROCEDURES:  Critical Care performed:   Procedures   MEDICATIONS ORDERED IN ED: Medications  fentaNYL (SUBLIMAZE) injection 50 mcg (50 mcg Intravenous Given 09/02/21 0647)  ondansetron (ZOFRAN) injection 4 mg (4 mg Intravenous Given 09/02/21  0648)  ketorolac (TORADOL) 30 MG/ML injection 30 mg (30 mg Intravenous Given 09/02/21 0759)     IMPRESSION / MDM / ASSESSMENT AND PLAN / ED COURSE  I reviewed the triage vital signs and the nursing notes.   Differential diagnosis includes, but is not limited to, left flank pain, kidney stone, acute UTI, back pain.  31 year old female presents to the ED with complaint of left flank pain that started during the night.  Patient has a history of kidney stones in the past and had Flexeril which she was given when she was pregnant and had a kidney stone which helped her sleep but pain returned this morning.  She has had 1 episode of vomiting but feels much better currently after getting fentanyl and Zofran.  Toradol 30 mg IV was given.  Urinalysis showed trace leukocytes but greater than 50,000 RBCs.  CMP was unremarkable with exception of potassium slightly decreased at 3.3 and glucose nonfasting was 139.  CBC was normal.  Pregnancy test was negative.  We discussed her CT scan results and left  urolithiasis measuring 3 mm.  She was given a prescription for Zofran, Percocet and Flomax.  Encouraging her to drink lots of fluids to help pass the stone and follow-up with Dr. Diamantina Providence, who is the urologist on-call this weekend.  She is aware that she will need to return to the emergency department if any severe worsening of her symptoms.        FINAL CLINICAL IMPRESSION(S) / ED DIAGNOSES   Final diagnoses:  Ureterolithiasis     Rx / DC Orders   ED Discharge Orders          Ordered    ondansetron (ZOFRAN-ODT) 4 MG disintegrating tablet  Every 8 hours PRN        09/02/21 0749    tamsulosin (FLOMAX) 0.4 MG CAPS capsule  Daily after breakfast        09/02/21 0749    oxyCODONE-acetaminophen (PERCOCET) 5-325 MG tablet  Every 4 hours PRN        09/02/21 0749             Note:  This document was prepared using Dragon voice recognition software and may include unintentional dictation errors.   Johnn Hai, PA-C 09/02/21 0932    Rada Hay, MD 09/02/21 [phone]

## 2021-09-04 ENCOUNTER — Other Ambulatory Visit: Payer: Self-pay

## 2021-09-04 ENCOUNTER — Emergency Department
Admission: EM | Admit: 2021-09-04 | Discharge: 2021-09-04 | Disposition: A | Discharge status: Home / Self Care | Payer: No Typology Code available for payment source | Attending: Emergency Medicine | Admitting: Emergency Medicine

## 2021-09-04 ENCOUNTER — Emergency Department: Payer: No Typology Code available for payment source

## 2021-09-04 DIAGNOSIS — N23 Unspecified renal colic: Secondary | ICD-10-CM

## 2021-09-04 DIAGNOSIS — N201 Calculus of ureter: Secondary | ICD-10-CM | POA: Insufficient documentation

## 2021-09-04 DIAGNOSIS — Z87442 Personal history of urinary calculi: Secondary | ICD-10-CM | POA: Insufficient documentation

## 2021-09-04 DIAGNOSIS — R109 Unspecified abdominal pain: Secondary | ICD-10-CM | POA: Diagnosis present

## 2021-09-04 LAB — URINALYSIS, ROUTINE W REFLEX MICROSCOPIC
Bilirubin Urine: NEGATIVE
Glucose, UA: NEGATIVE mg/dL
Nitrite: NEGATIVE
Protein, ur: 30 mg/dL — AB
Specific Gravity, Urine: 1.02 (ref 1.005–1.030)
pH: 7.5 (ref 5.0–8.0)

## 2021-09-04 LAB — URINALYSIS, MICROSCOPIC (REFLEX)
Bacteria, UA: NONE SEEN
RBC / HPF: >50 RBC/hpf (ref 0–5)

## 2021-09-04 MED ORDER — OXYCODONE HCL 5 MG PO TABS
5.0000 mg | ORAL_TABLET | Freq: Once | ORAL | Status: AC
  Administered 2021-09-04: 5 mg via ORAL
  Filled 2021-09-04: qty 1

## 2021-09-04 MED ORDER — HYDROMORPHONE HCL 1 MG/ML IJ SOLN
1.0000 mg | Freq: Once | INTRAMUSCULAR | Status: AC
  Administered 2021-09-04: 1 mg via INTRAVENOUS
  Filled 2021-09-04: qty 1

## 2021-09-04 MED ORDER — KETOROLAC TROMETHAMINE 30 MG/ML IJ SOLN
15.0000 mg | Freq: Once | INTRAMUSCULAR | Status: AC
  Administered 2021-09-04: 15 mg via INTRAVENOUS
  Filled 2021-09-04: qty 1

## 2021-09-04 MED ORDER — ACETAMINOPHEN 500 MG PO TABS
1000.0000 mg | ORAL_TABLET | Freq: Once | ORAL | Status: DC
  Filled 2021-09-04: qty 2

## 2021-09-04 MED ORDER — FENTANYL CITRATE PF 50 MCG/ML IJ SOSY
50.0000 ug | PREFILLED_SYRINGE | Freq: Once | INTRAMUSCULAR | Status: AC
  Administered 2021-09-04: 50 ug via INTRAVENOUS
  Filled 2021-09-04: qty 1

## 2021-09-04 MED ORDER — HYDROCODONE-ACETAMINOPHEN 5-325 MG PO TABS: 1.0000 | ORAL_TABLET | ORAL | 0 refills | Status: DC | PRN

## 2021-09-04 MED ORDER — TAMSULOSIN HCL 0.4 MG PO CAPS
0.4000 mg | ORAL_CAPSULE | Freq: Once | ORAL | Status: AC
  Administered 2021-09-04: 0.4 mg via ORAL
  Filled 2021-09-04: qty 1

## 2021-09-04 MED ORDER — ONDANSETRON HCL 4 MG/2ML IJ SOLN
4.0000 mg | Freq: Once | INTRAMUSCULAR | Status: AC
Start: 2021-09-04 — End: 2021-09-04
  Administered 2021-09-04: 4 mg via INTRAVENOUS
  Filled 2021-09-04: qty 2

## 2021-09-04 NOTE — ED Notes (Signed)
Patient transported to CT 

## 2021-09-04 NOTE — ED Triage Notes (Signed)
Pt c/o left flank pain since Saturday morning. Pt reports she was seen here on Saturday and diagnosed with a kidney stone. Pt reports she woke up this morning in extreme pain.

## 2021-09-04 NOTE — ED Provider Notes (Signed)
[facility] Provider Note    Event Date/Time   First MD Initiated Contact with Patient 09/04/21 0532     (approximate)   History   Flank Pain   HPI  Renee Sanchez is a 31 y.o. female with a history of kidney stones who presents for evaluation of left flank pain.  Patient was seen here 2 days ago and diagnosed with a left ureteral stone.  She reports that her pain subsided 24 hours after the visit.  She was doing well until she woke up this morning with excruciating left-sided sharp flank pain that is identical to her prior episodes of kidney stones.  The pain is sharp, severe, radiating to her lower abdomen associated with nausea.  No dysuria or hematuria, no fever.     Past Medical History:  Diagnosis Date   Anemia    Anxiety    Depression    Family history of breast cancer    Family history of colon cancer    Family history of ovarian cancer    Family history of pancreatic cancer    GERD (gastroesophageal reflux disease)    History of kidney stones    Kidney stone 09/02/2021   Normal intrauterine pregnancy in third trimester 08/25/2014   Retained products of conception, early pregnancy 01/17/2017    Past Surgical History:  Procedure Laterality Date   DILATION AND CURETTAGE OF UTERUS  01/14/2017   kidney stent     kidney stent removal and removal of kidney stone       Physical Exam   Triage Vital Signs: ED Triage Vitals  Enc Vitals Group     BP 09/04/21 0528 (!) 141/77     Pulse Rate 09/04/21 0528 97     Resp 09/04/21 0528 20     Temp 09/04/21 0528 97.9 F (36.6 C)     Temp Source 09/04/21 0528 Oral     SpO2 09/04/21 0528 100 %     Weight 09/04/21 0529 179 lb 14.3 oz (81.6 kg)     Height 09/04/21 0529 5\' 3"  (1.6 m)     Head Circumference --      Peak Flow --      Pain Score 09/04/21 0529 10     Pain Loc --      Pain Edu? --      Excl. in [place]? --     Most recent vital signs: Vitals:   09/04/21 0528  BP: (!) 141/77  Pulse:  97  Resp: 20  Temp: 97.9 F (36.6 C)  SpO2: 100%     Constitutional: Alert and oriented.  Looks extremely uncomfortable and moaning due to pain  HEENT:      Head: Normocephalic and atraumatic.         Eyes: Conjunctivae are normal. Sclera is non-icteric.       Mouth/Throat: Mucous membranes are moist.       Neck: Supple with no signs of meningismus. Cardiovascular: Regular rate and rhythm. No murmurs, gallops, or rubs. 2+ symmetrical distal pulses are present in all extremities.  Respiratory: Normal respiratory effort. Lungs are clear to auscultation bilaterally.  Gastrointestinal: Soft, non tender, and non distended with positive bowel sounds. No rebound or guarding. Genitourinary: No CVA tenderness. Musculoskeletal:  No edema, cyanosis, or erythema of extremities. Neurologic: Normal speech and language. Face is symmetric. Moving all extremities. No gross focal neurologic deficits are appreciated. Skin: Skin is warm, dry and intact. No rash noted. Psychiatric: Mood and affect  are normal. Speech and behavior are normal.  ED Results / Procedures / Treatments   Labs (all labs ordered are listed, but only abnormal results are displayed) Labs Reviewed  URINALYSIS, ROUTINE W REFLEX MICROSCOPIC - Abnormal; Notable for the following components:      Result Value   Hgb urine dipstick LARGE (*)    Ketones, ur TRACE (*)    Protein, ur 30 (*)    Leukocytes,Ua TRACE (*)    All other components within normal limits  URINE CULTURE  URINALYSIS, MICROSCOPIC (REFLEX)  POC URINE PREG, ED     EKG  none   RADIOLOGY none   PROCEDURES:  Critical Care performed: No  Procedures    IMPRESSION / MDM / [place] / ED COURSE  I reviewed the triage vital signs and the nursing notes.  31 y.o. female with a history of kidney stones who presents for evaluation of left flank pain.  Patient seen here 2 days ago and had a CT showing a left ureteral stone.  Patient looks  uncomfortable due to pain, abdomen is soft and nontender  Ddx: Kidney stone versus pyelonephritis   Plan: We will give a dose of IV Toradol and Zofran, will check UA to rule out overlying infection.  Patient did have labs and a CT done 2 days ago therefore we will hold off on further labs or imaging at this time.  She also had a pregnancy test which was negative.   MEDICATIONS GIVEN IN ED: Medications  ketorolac (TORADOL) 30 MG/ML injection 15 mg (15 mg Intravenous Given 09/04/21 0539)  ondansetron (ZOFRAN) injection 4 mg (4 mg Intravenous Given 09/04/21 0539)  tamsulosin (FLOMAX) capsule 0.4 mg (0.4 mg Oral Given 09/04/21 0623)  acetaminophen (TYLENOL) tablet 1,000 mg (1,000 mg Oral Given 09/04/21 0622)  oxyCODONE (Oxy IR/ROXICODONE) immediate release tablet 5 mg (5 mg Oral Given 09/04/21 1324)  fentaNYL (SUBLIMAZE) injection 50 mcg (50 mcg Intravenous Given 09/04/21 0640)     ED COURSE: UA showing blood which is consistent with recent diagnosis of kidney stone but no signs of overlying infection.  Patient received IV fentanyl, IV Toradol, and was transition to p.o. meds with Tylenol, oxycodone and Flomax.  Plan to reassess and if patient's pain is well controlled will discharge home with follow-up with urology.  If patient's pain is not well controlled anticipate admission to the hospital.  Care transferred to Dr. Cheri Fowler at 7 AM.   Consults: None   EMR reviewed including CT from 2 days ago    FINAL CLINICAL IMPRESSION(S) / ED DIAGNOSES   Final diagnoses:  Ureteral stone  Renal colic     Rx / DC Orders   ED Discharge Orders     None        Note:  This document was prepared using Dragon voice recognition software and may include unintentional dictation errors.   Please note:  Patient was evaluated in Emergency Department today for the symptoms described in the history of present illness. Patient was evaluated in the context of the global COVID-19 pandemic, which  necessitated consideration that the patient might be at risk for infection with the SARS-CoV-2 virus that causes COVID-19. Institutional protocols and algorithms that pertain to the evaluation of patients at risk for COVID-19 are in a state of rapid change based on information released by regulatory bodies including the CDC and federal and state organizations. These policies and algorithms were followed during the patient's care in the ED.  Some ED evaluations  and interventions may be delayed as a result of limited staffing during the pandemic.       Alfred Levins, [place], MD 09/04/21 [phone]

## 2021-09-05 LAB — URINE CULTURE: Culture: <10000 — AB

## 2021-09-14 ENCOUNTER — Encounter: Payer: Self-pay | Admitting: Cardiology

## 2021-09-14 ENCOUNTER — Other Ambulatory Visit: Payer: Self-pay

## 2021-09-14 ENCOUNTER — Ambulatory Visit: Payer: No Typology Code available for payment source | Admitting: Cardiology

## 2021-09-14 ENCOUNTER — Ambulatory Visit (INDEPENDENT_AMBULATORY_CARE_PROVIDER_SITE_OTHER): Payer: No Typology Code available for payment source

## 2021-09-14 VITALS — BP 110/70 | HR 78 | Ht 63.0 in | Wt 178.0 lb

## 2021-09-14 DIAGNOSIS — R002 Palpitations: Secondary | ICD-10-CM

## 2021-09-14 DIAGNOSIS — R55 Syncope and collapse: Secondary | ICD-10-CM | POA: Diagnosis not present

## 2021-09-14 DIAGNOSIS — R790 Abnormal level of blood mineral: Secondary | ICD-10-CM | POA: Diagnosis not present

## 2021-09-14 DIAGNOSIS — R0602 Shortness of breath: Secondary | ICD-10-CM | POA: Diagnosis not present

## 2021-09-14 NOTE — Patient Instructions (Signed)
Medication Instructions:  The current medical regimen is effective;  continue present plan and medications.  *If you need a refill on your cardiac medications before your next appointment, please call your pharmacy*  Testing/Procedures: Your physician has requested that you have an echocardiogram. Echocardiography is a painless test that uses sound waves to create images of your heart. It provides your doctor with information about the size and shape of your heart and how well your hearts chambers and valves are working. This procedure takes approximately one hour. There are no restrictions for this procedure.  ZIO XT- Long Term Monitor Instructions  Your physician has requested you wear a ZIO patch monitor for 14 days.  This is a single patch monitor. [employer] supplies one patch monitor per enrollment. Additional stickers are not available. Please do not apply patch if you will be having a Nuclear Stress Test,  Echocardiogram, Cardiac CT, MRI, or Chest Xray during the period you would be wearing the  monitor. The patch cannot be worn during these tests. You cannot remove and re-apply the  ZIO XT patch monitor.  Your ZIO patch monitor will be mailed 3 day USPS to your address on file. It may take 3-5 days  to receive your monitor after you have been enrolled.  Once you have received your monitor, please review the enclosed instructions. Your monitor  has already been registered assigning a specific monitor serial # to you.  Billing and Patient Assistance Program Information  We have supplied [employer] with any of your insurance information on file for billing purposes. [employer] offers a sliding scale Patient Assistance Program for patients that do not have  insurance, or whose insurance does not completely cover the cost of the ZIO monitor.  You must apply for the Patient Assistance Program to qualify for this discounted rate.  To apply, please call [employer] at [phone], select option 4,  select option 2, ask to apply for  Patient Assistance Program. Theodore Demark will ask your household income, and how many people  are in your household. They will quote your out-of-pocket cost based on that information.  [employer] will also be able to set up a 55-month, interest-free payment plan if needed.  Applying the monitor   Shave hair from upper left chest.  Hold abrader disc by orange tab. Rub abrader in 40 strokes over the upper left chest as  indicated in your monitor instructions.  Clean area with 4 enclosed alcohol pads. Let dry.  Apply patch as indicated in monitor instructions. Patch will be placed under collarbone on left  side of chest with arrow pointing upward.  Rub patch adhesive wings for 2 minutes. Remove white label marked "1". Remove the white  label marked "2". Rub patch adhesive wings for 2 additional minutes.  While looking in a mirror, press and release button in center of patch. A small green light will  flash 3-4 times. This will be your only indicator that the monitor has been turned on.  Do not shower for the first 24 hours. You may shower after the first 24 hours.  Press the button if you feel a symptom. You will hear a small click. Record Date, Time and  Symptom in the Patient Logbook.  When you are ready to remove the patch, follow instructions on the last 2 pages of Patient  Logbook. Stick patch monitor onto the last page of Patient Logbook.  Place Patient Logbook in the blue and white box. Use locking tab on box and tape box  closed  securely. The blue and white box has prepaid postage on it. Please place it in the mailbox as  soon as possible. Your physician should have your test results approximately 7 days after the  monitor has been mailed back to [facility].  Call [place] at [phone] if you have questions regarding  your ZIO XT patch monitor. Call them immediately if you see an orange light blinking on your  monitor.  If your  monitor falls off in less than 4 days, contact our Monitor department at [phone].  If your monitor becomes loose or falls off after 4 days call [employer] at [phone] for  suggestions on securing your monitor  Follow-Up: At [facility], you and your health needs are our priority.  As part of our continuing mission to provide you with exceptional heart care, we have created designated Provider Care Teams.  These Care Teams include your primary Cardiologist (physician) and Advanced Practice Providers (APPs -  Physician Assistants and Nurse Practitioners) who all work together to provide you with the care you need, when you need it.  We recommend signing up for the patient portal called "MyChart".  Sign up information is provided on this After Visit Summary.  MyChart is used to connect with patients for Virtual Visits (Telemedicine).  Patients are able to view lab/test results, encounter notes, upcoming appointments, etc.  Non-urgent messages can be sent to your provider as well.   To learn more about what you can do with MyChart, go to [email or web address].    Your next appointment:   Follow up will be based on the results of the above testing.  Thank you for choosing [facility]!!

## 2021-09-14 NOTE — Assessment & Plan Note (Signed)
She states that her ferritin has been low compatible with iron deficiency anemia however her hemoglobin is normal.  She continues to take iron supplementation.  Described to her that heavy menses can be a common culprit.

## 2021-09-14 NOTE — Progress Notes (Signed)
Cardiology Office Note:    Date:  09/14/2021   ID:  Renee Sanchez, DOB 07-21-1991, MRN [id number]  PCP:  [facility]  Cardiologist:  Candee Furbish, MD    Referring MD: London Pepper, MD     History of Present Illness:    Renee Sanchez is a 31 y.o. female with a hx of anemia, GERD, and anxiety here today for the evaluation of palpitations at the request of Dr. Orland Mustard.   She last saw Dr. Orland Mustard 08/15/21 where she complained of palpitations and lightheadedness. Starting Tuesday prior, she also reported sore throat, ear pain, and sweet smelling urine. She believed she might have been pregnant because she was 3 days late fore her period.   She presented to the ED 09/02/21 for L ureter stone. That evening, she was trying to lay down when she became nauseous. She also had progressively worsening L-flank pain and vomited once. She rated the pain a 7/10 on the pain scale. She endorsed a history of kidney stones and the last stone occurred when she was pregnant a few years ago. At that time, she was given Flexeril. She tried the same medication again but the pain persisted. She was discharged with Zofran, Percocet, Flomax, and instructions to drink fluids to help the stone pass. Of note, pregnancy test was negative.   She presented to the ED again 09/04/21 for worsening pain due to the ureteral stone. She was given a dose of Toradol IV and Zofran. Urinalysis showed no signs of underlying infection. She received IV fentanyl, IV Toradol, and was transitioned to p.o. meds with Tylenol, oxycodone and Flomax. She was discharged after pain was better managed. Pregnancy test remained negative.   Today, she reports having palpitations. The palpitations have been a chronic issue. However, recently the palpitations have become more frequent with new symptoms.   Recently, she becomes short of breath with minimal activity. Going up the stairs, for example, will leave her winded.   She will  also become lightheaded with exertion or positional changes. After light exercise, her heart rate will jump to 180s bpm. This heart racing prevents her from pushing herself with exercise. She has been switching to light weight and slow pace exercises.  She stood up from her desk at work and became lightheaded. When she looked at her [place], her average heart rate that day was in the 50s. She reports that rate was unusually low for her. Since then, she notices her heart rate could range from 40 to 186.   When she last saw Dr. Orland Mustard, she also had L arm pain that worried her.   After her second child, her periods have been less frequent. She has been told she does not have iron-deficiency anemia but has been recommended to try iron supplements.   She endorses anxiety and ADHD. She has not taken Adderall or Vyvase in about 4 years. She wonders if the palpitations are related to anxiety. She has 2 children with a history of multiple miscarriages. She is a former smoker and quit in 2014. She does not drink alcohol. Her father has mitral valve prolapse. She endorses her diet is not great.   She denies any chest pain, headaches, syncope, orthopnea, PND, or lower extremity edema.  Past Medical History:  Diagnosis Date   ADD (attention deficit disorder)    Amenorrhea    Anemia    Anxiety    Chronic fatigue    Depression    Family  history of breast cancer    Family history of colon cancer    Family history of ovarian cancer    Family history of pancreatic cancer    GERD (gastroesophageal reflux disease)    History of kidney stones    Insomnia    Kidney stone 09/02/2021   Late menses    Low iron    Missed period    Nephrolithiasis    Normal intrauterine pregnancy in third trimester 08/25/2014   Palpitations    Panic disorder    Retained products of conception, early pregnancy 01/17/2017    Past Surgical History:  Procedure Laterality Date   DILATION AND CURETTAGE OF UTERUS   01/14/2017   kidney stent     kidney stent removal and removal of kidney stone      Current Medications: Current Meds  Medication Sig   acetaminophen (TYLENOL) 500 MG tablet Take 500 mg by mouth every 6 (six) hours as needed for mild pain.     Allergies:   Bupropion   Social History   Socioeconomic History   Marital status: Married    Spouse name: Renee Sanchez   Number of children: Not on file   Years of education: Not on file   Highest education level: Not on file  Occupational History   Not on file  Tobacco Use   Smoking status: Former   Smokeless tobacco: Never  Substance and Sexual Activity   Alcohol use: Not Currently    Comment: social   Drug use: No   Sexual activity: Yes    Birth control/protection: None  Other Topics Concern   Not on file  Social History Narrative   Not on file   Social Determinants of Health   Financial Resource Strain: Not on file  Food Insecurity: Not on file  Transportation Needs: Not on file  Physical Activity: Not on file  Stress: Not on file  Social Connections: Not on file     Family History: The patient's family history includes ADD / ADHD in her brother and father; Alcohol abuse in her father; Anxiety disorder in her father; Arthritis in her paternal grandmother; Breast cancer in her maternal grandmother; Breast cancer (age of onset: 64) in her paternal grandmother; COPD in her paternal grandmother; Cancer in her paternal grandmother; Cancer - Other in her paternal grandmother; Cervical cancer in her paternal aunt; Colon cancer in her paternal great-grandmother and another family member; Depression in her father; Diabetes in her paternal grandmother; Drug abuse in her father; Pancreatic cancer in an other family member; Pancreatic cancer (age of onset: 46) in her paternal uncle; Varicose Veins in her mother.  ROS:   Please see the history of present illness.    (+) Palpitations (+) Dyspnea on exertion (+) Lightheadedness (+) L arm  pain All other systems reviewed and negative.   EKGs/Labs/Other Studies Reviewed:    The following studies were reviewed today: No prior cardiovascular studies  EKG:  EKG was personally reviewed 09/14/21: Sinus rhythm, rate 78 bpm  Recent Labs: 09/02/2021: ALT 16; BUN 14; Creatinine, Ser 0.93; Hemoglobin 13.3; Platelets 259; Potassium 3.3; Sodium 138   Recent Lipid Panel No results found for: CHOL, TRIG, HDL, CHOLHDL, VLDL, LDLCALC, LDLDIRECT  CHA2DS2-VASc Score =   [ ] .  Therefore, the patient's annual risk of stroke is   %.       Physical Exam:    VS:  BP 110/70 (BP Location: Left Arm, Patient Position: Sitting, Cuff Size: Normal)    Pulse 78  Ht 5\' 3"  (1.6 m)    Wt 178 lb (80.7 kg)    LMP  (LMP Unknown)    BMI 31.53 kg/m     Wt Readings from Last 3 Encounters:  09/14/21 178 lb (80.7 kg)  09/04/21 179 lb 14.3 oz (81.6 kg)  09/02/21 180 lb (81.6 kg)     GEN:  Well nourished, well developed in no acute distress HEENT: Normal NECK: No JVD; No carotid bruits LYMPHATICS: No lymphadenopathy CARDIAC: RRR, no murmurs, rubs, gallops RESPIRATORY:  Clear to auscultation without rales, wheezing or rhonchi  ABDOMEN: Soft, non-tender, non-distended MUSCULOSKELETAL:  No edema; No deformity  SKIN: Warm and dry NEUROLOGIC:  Alert and oriented x 3 PSYCHIATRIC:  Normal affect   ASSESSMENT:    1. Palpitations   2. Shortness of breath   3. Low ferritin   4. Near syncope    PLAN:    Palpitations We will go ahead and check a Zio patch monitor to ensure that she does not have any adverse arrhythmias and to accurately assess her heart rates especially during exercise.  Shortness of breath We will check an echocardiogram.  Her father has had mitral valve prolapse.  She states that going up a flight of stairs she will have shortness of breath.  I want to make sure that she has normal structure and function.  Low ferritin She states that her ferritin has been low compatible with iron  deficiency anemia however her hemoglobin is normal.  She continues to take iron supplementation.  Described to her that heavy menses can be a common culprit.  Near syncope Sometimes you feel dizzy especially when getting up from seated exercises.  Could be vagal type phenomenon or vasodilatation after exercise.  She has not had high risk symptoms such as syncope during exercise.  Continue to hydrate well.  ZIO monitor will be helpful.      Medication Adjustments/Labs and Tests Ordered: Current medicines are reviewed at length with the patient today.  Concerns regarding medicines are outlined above.  Orders Placed This Encounter  Procedures   LONG TERM MONITOR (3-14 DAYS)   EKG 12-Lead   ECHOCARDIOGRAM COMPLETE   No orders of the defined types were placed in this encounter.  I,Mykaella Javier,acting as a scribe for [employer], MD.,have documented all relevant documentation on the behalf of Candee Furbish, MD,as directed by  Candee Furbish, MD while in the presence of Candee Furbish, MD.  I, Candee Furbish, MD, have reviewed all documentation for this visit. The documentation on 09/14/21 for the exam, diagnosis, procedures, and orders are all accurate and complete.   Signed, Candee Furbish, MD  09/14/2021 11:53 AM    [facility]

## 2021-09-14 NOTE — Assessment & Plan Note (Signed)
Sometimes you feel dizzy especially when getting up from seated exercises.  Could be vagal type phenomenon or vasodilatation after exercise.  She has not had high risk symptoms such as syncope during exercise.  Continue to hydrate well.  ZIO monitor will be helpful.

## 2021-09-14 NOTE — Progress Notes (Unsigned)
Enrolled for [employer] to mail a ZIO XT long term holter monitor to the patients address on file.  

## 2021-09-14 NOTE — Assessment & Plan Note (Signed)
We will check an echocardiogram.  Her father has had mitral valve prolapse.  She states that going up a flight of stairs she will have shortness of breath.  I want to make sure that she has normal structure and function.

## 2021-09-14 NOTE — Assessment & Plan Note (Signed)
We will go ahead and check a Zio patch monitor to ensure that she does not have any adverse arrhythmias and to accurately assess her heart rates especially during exercise.

## 2021-09-21 ENCOUNTER — Ambulatory Visit (HOSPITAL_COMMUNITY): Payer: No Typology Code available for payment source | Attending: Cardiology

## 2021-09-21 ENCOUNTER — Other Ambulatory Visit: Payer: Self-pay

## 2021-09-21 DIAGNOSIS — R002 Palpitations: Secondary | ICD-10-CM

## 2021-09-21 DIAGNOSIS — R0602 Shortness of breath: Secondary | ICD-10-CM | POA: Insufficient documentation

## 2021-09-21 LAB — ECHOCARDIOGRAM COMPLETE
Area-P 1/2: 3.53 cm2
S' Lateral: 2.6 cm

## 2021-10-18 ENCOUNTER — Other Ambulatory Visit (HOSPITAL_COMMUNITY): Payer: Self-pay

## 2021-10-18 ENCOUNTER — Telehealth: Payer: Self-pay

## 2021-10-18 MED ORDER — METOPROLOL SUCCINATE ER 25 MG PO TB24
12.5000 mg | ORAL_TABLET | Freq: Every day | ORAL | 3 refills | Status: DC
  Filled 2021-10-18: qty 45, 90d supply, fill #0

## 2021-10-18 NOTE — Addendum Note (Signed)
Addended by: Molli Barrows on: 10/18/2021 05:04 PM ? ? Modules accepted: Orders ? ?

## 2021-10-18 NOTE — Telephone Encounter (Signed)
-----   Message from Jerline Pain, MD sent at 10/18/2021  7:16 AM EDT ----- ?? NSR - avg HR 78 bpm ?? Rare PAC's/PVC's ?? Brief NSVT for 4 beats (symptoms noted surrounding this) ?? Most symptoms of fluttering or racing were associated with NSR and PAC's/PVC ?? ECHO was normal ?? Recommend Toprol XL 12.5 mg PO once a day ?

## 2021-10-18 NOTE — Telephone Encounter (Signed)
Per Dr. Marlou Porch: ? ?Renee Pain, MD  You 5 minutes ago (4:54 PM)  ? ?It is certainly not mandatory to start this medication, low-dose Toprol.  I would be fine without the medication.  Just wanted to provide this to her to help her with her symptoms but certainly we do not need to use it.   ? ?Patient states she will try the low dose metoprolol. ? ?Ordered Toprol XL 12.'5mg'$  QD, sent to pharmacy of choice. ? ?

## 2021-10-18 NOTE — Telephone Encounter (Signed)
Spoke with patient to discuss results of heart monitor. ? ?Per Dr. Marlou Porch: ??           NSR - avg HR 78 bpm ??           Rare PAC's/PVC's ??           Brief NSVT for 4 beats (symptoms noted surrounding this) ??           Most symptoms of fluttering or racing were associated with NSR and PAC's/PVC ??           ECHO was normal ??           Recommend Toprol XL 12.5 mg PO once a day ? ?Patient states she is hesitant to start Toprol and lower her heart rate more than what it averaged. She states "if my heart was beating fast only one time in 14 days then I don't know if I want to start a medication to lower my heart rate." Patient is concerned the Toprol will lower her heart rate too much. ? ?She would like clarification on the reason for starting Toprol. ? ?Will forward to Dr. Marlou Porch to review and advise. ?

## 2021-10-23 ENCOUNTER — Telehealth: Payer: No Typology Code available for payment source | Admitting: Family Medicine

## 2021-10-23 DIAGNOSIS — J029 Acute pharyngitis, unspecified: Secondary | ICD-10-CM

## 2021-10-23 NOTE — Progress Notes (Signed)
E-Visit for Sore Throat  We are sorry that you are not feeling well.  Here is how we plan to help!  Your symptoms indicate a likely viral infection (Pharyngitis).   Pharyngitis is inflammation in the back of the throat which can cause a sore throat, scratchiness and sometimes difficulty swallowing.   Pharyngitis is typically caused by a respiratory virus and will just run its course.  Please keep in mind that your symptoms could last up to 10 days.  For throat pain, we recommend over the counter oral pain relief medications such as acetaminophen or aspirin, or anti-inflammatory medications such as ibuprofen or naproxen sodium.  Topical treatments such as oral throat lozenges or sprays may be used as needed.  Avoid close contact with loved ones, especially the very young and elderly.  Remember to wash your hands thoroughly throughout the day as this is the number one way to prevent the spread of infection and wipe down door knobs and counters with disinfectant.  After careful review of your answers, I would not recommend an antibiotic for your condition.  Antibiotics should not be used to treat conditions that we suspect are caused by viruses like the virus that causes the common cold or flu. However, some people can have Strep with atypical symptoms. You may need formal testing in clinic or office to confirm if your symptoms continue or worsen.  Providers prescribe antibiotics to treat infections caused by bacteria. Antibiotics are very powerful in treating bacterial infections when they are used properly.  To maintain their effectiveness, they should be used only when necessary.  Overuse of antibiotics has resulted in the development of super bugs that are resistant to treatment!    Home Care: Only take medications as instructed by your medical team. Do not drink alcohol while taking these medications. A steam or ultrasonic humidifier can help congestion.  You can place a towel over your head and  breathe in the steam from hot water coming from a faucet. Avoid close contacts especially the very young and the elderly. Cover your mouth when you cough or sneeze. Always remember to wash your hands.  Get Help Right Away If: You develop worsening fever or throat pain. You develop a severe head ache or visual changes. Your symptoms persist after you have completed your treatment plan.  Make sure you Understand these instructions. Will watch your condition. Will get help right away if you are not doing well or get worse.   Thank you for choosing an e-visit.  Your e-visit answers were reviewed by a board certified advanced clinical practitioner to complete your personal care plan. Depending upon the condition, your plan could have included both over the counter or prescription medications.  Please review your pharmacy choice. Make sure the pharmacy is open so you can pick up prescription now. If there is a problem, you may contact your provider through MyChart messaging and have the prescription routed to another pharmacy.  Your safety is important to us. If you have drug allergies check your prescription carefully.   For the next 24 hours you can use MyChart to ask questions about today's visit, request a non-urgent call back, or ask for a work or school excuse. You will get an email in the next two days asking about your experience. I hope that your e-visit has been valuable and will speed your recovery.   I provided 5 minutes of non face-to-face time during this encounter for chart review, medication and order placement, as   well as and documentation.   

## 2021-10-26 ENCOUNTER — Other Ambulatory Visit (HOSPITAL_COMMUNITY): Payer: Self-pay

## 2022-04-13 ENCOUNTER — Other Ambulatory Visit (HOSPITAL_COMMUNITY): Payer: Self-pay

## 2022-04-13 MED ORDER — SUMATRIPTAN SUCCINATE 25 MG PO TABS
25.0000 mg | ORAL_TABLET | Freq: Two times a day (BID) | ORAL | 5 refills | Status: DC
  Filled 2022-04-13: qty 18, 30d supply, fill #0

## 2022-04-24 ENCOUNTER — Other Ambulatory Visit (HOSPITAL_COMMUNITY): Payer: Self-pay

## 2022-08-14 DIAGNOSIS — F419 Anxiety disorder, unspecified: Secondary | ICD-10-CM | POA: Diagnosis not present

## 2022-08-14 DIAGNOSIS — N926 Irregular menstruation, unspecified: Secondary | ICD-10-CM | POA: Diagnosis not present

## 2022-08-14 DIAGNOSIS — R5383 Other fatigue: Secondary | ICD-10-CM | POA: Diagnosis not present

## 2022-08-14 DIAGNOSIS — E538 Deficiency of other specified B group vitamins: Secondary | ICD-10-CM | POA: Diagnosis not present

## 2022-08-14 DIAGNOSIS — R790 Abnormal level of blood mineral: Secondary | ICD-10-CM | POA: Diagnosis not present

## 2022-08-14 DIAGNOSIS — Z5181 Encounter for therapeutic drug level monitoring: Secondary | ICD-10-CM | POA: Diagnosis not present

## 2022-08-14 DIAGNOSIS — Z9189 Other specified personal risk factors, not elsewhere classified: Secondary | ICD-10-CM | POA: Diagnosis not present

## 2022-08-14 DIAGNOSIS — E611 Iron deficiency: Secondary | ICD-10-CM | POA: Diagnosis not present

## 2022-08-14 DIAGNOSIS — G43709 Chronic migraine without aura, not intractable, without status migrainosus: Secondary | ICD-10-CM | POA: Diagnosis not present

## 2022-08-14 DIAGNOSIS — E559 Vitamin D deficiency, unspecified: Secondary | ICD-10-CM | POA: Diagnosis not present

## 2022-08-15 DIAGNOSIS — N926 Irregular menstruation, unspecified: Secondary | ICD-10-CM | POA: Diagnosis not present

## 2022-08-15 DIAGNOSIS — E538 Deficiency of other specified B group vitamins: Secondary | ICD-10-CM | POA: Diagnosis not present

## 2022-08-15 DIAGNOSIS — E559 Vitamin D deficiency, unspecified: Secondary | ICD-10-CM | POA: Diagnosis not present

## 2022-08-15 DIAGNOSIS — R6882 Decreased libido: Secondary | ICD-10-CM | POA: Diagnosis not present

## 2022-08-15 DIAGNOSIS — Z0000 Encounter for general adult medical examination without abnormal findings: Secondary | ICD-10-CM | POA: Diagnosis not present

## 2022-08-15 DIAGNOSIS — R5383 Other fatigue: Secondary | ICD-10-CM | POA: Diagnosis not present

## 2022-08-15 DIAGNOSIS — R790 Abnormal level of blood mineral: Secondary | ICD-10-CM | POA: Diagnosis not present

## 2022-08-20 ENCOUNTER — Telehealth: Payer: Self-pay | Admitting: Hematology

## 2022-08-20 NOTE — Telephone Encounter (Signed)
Scheduled appt per 1/26 referral. Pt is aware of appt date and time. Pt is aware to arrive 15 mins prior to appt time and to bring and updated insurance card. Pt is aware of appt location.

## 2022-08-31 ENCOUNTER — Other Ambulatory Visit: Payer: Self-pay

## 2022-08-31 ENCOUNTER — Inpatient Hospital Stay: Payer: 59 | Attending: Hematology | Admitting: Hematology

## 2022-08-31 ENCOUNTER — Encounter: Payer: Self-pay | Admitting: Hematology

## 2022-08-31 VITALS — BP 129/71 | HR 65 | Temp 97.8°F | Resp 14 | Wt 164.9 lb

## 2022-08-31 DIAGNOSIS — D500 Iron deficiency anemia secondary to blood loss (chronic): Secondary | ICD-10-CM

## 2022-08-31 DIAGNOSIS — N920 Excessive and frequent menstruation with regular cycle: Secondary | ICD-10-CM | POA: Insufficient documentation

## 2022-08-31 DIAGNOSIS — E611 Iron deficiency: Secondary | ICD-10-CM | POA: Diagnosis not present

## 2022-08-31 NOTE — Progress Notes (Deleted)
[place]   Telephone:(336) [phone] [phone]) [phone]   Clinic New Consult Note   Patient Care Team: Pa, [place] as PCP - General (Family Medicine) Jerline Pain, MD as PCP - Cardiology (Cardiology)  Date of Service:  08/31/2022   CHIEF COMPLAINTS/PURPOSE OF CONSULTATION:  Low Ferritin  REFERRING PHYSICIAN:  Saintclair Halsted, FNP  HISTORY OF PRESENTING ILLNESS:  Renee PENSON 32 y.o. female is a here because of Low Ferritin. The patient was referred by Saintclair Halsted, FNP. The patient presents to the clinic today accompanied by ***.   {Story of what lead them here}   Today the patient notes they felt/feeling prior/after...    {She/He} has a PMHx of....    Socially...    REVIEW OF SYSTEMS:  *** Constitutional: Denies fevers, chills or abnormal night sweats Eyes: Denies blurriness of vision, double vision or watery eyes Ears, nose, mouth, throat, and face: Denies mucositis or sore throat Respiratory: Denies cough, dyspnea or wheezes Cardiovascular: Denies palpitation, chest discomfort or lower extremity swelling Gastrointestinal:  Denies nausea, heartburn or change in bowel habits Skin: Denies abnormal skin rashes Lymphatics: Denies new lymphadenopathy or easy bruising Neurological:Denies numbness, tingling or new weaknesses Behavioral/Psych: Mood is stable, no new changes  All other systems were reviewed with the patient and are negative.   MEDICAL HISTORY:  Past Medical History:  Diagnosis Date   ADD (attention deficit disorder)    Amenorrhea    Anemia    Anxiety    Chronic fatigue    Depression    Family history of breast cancer    Family history of colon cancer    Family history of ovarian cancer    Family history of pancreatic cancer    GERD (gastroesophageal reflux disease)    History of kidney stones    Insomnia    Kidney stone 09/02/2021   Late menses    Low iron    Missed period    Nephrolithiasis     Normal intrauterine pregnancy in third trimester 08/25/2014   Palpitations    Panic disorder    Retained products of conception, early pregnancy 01/17/2017    SURGICAL HISTORY: Past Surgical History:  Procedure Laterality Date   DILATION AND CURETTAGE OF UTERUS  01/14/2017   kidney stent     kidney stent removal and removal of kidney stone      SOCIAL HISTORY: Social History   Socioeconomic History   Marital status: Married    Spouse name: Lovena Le   Number of children: Not on file   Years of education: Not on file   Highest education level: Not on file  Occupational History   Not on file  Tobacco Use   Smoking status: Former   Smokeless tobacco: Never  Substance and Sexual Activity   Alcohol use: Not Currently    Comment: social   Drug use: No   Sexual activity: Yes    Birth control/protection: None  Other Topics Concern   Not on file  Social History Narrative   Not on file   Social Determinants of Health   Financial Resource Strain: Low Risk  (04/18/2019)   Overall Financial Resource Strain (CARDIA)    Difficulty of Paying Living Expenses: Not hard at all  Food Insecurity: No Food Insecurity (04/18/2019)   Hunger Vital Sign    Worried About Running Out of Food in the Last Year: Never true    Ran Out of Food in the Last Year: Never true  Transportation Needs: No Transportation Needs (04/18/2019)   PRAPARE - Hydrologist (Medical): No    Lack of Transportation (Non-Medical): No  Physical Activity: Unknown (04/18/2019)   Exercise Vital Sign    Days of Exercise per Week: Patient refused    Minutes of Exercise per Session: Patient refused  Stress: Stress Concern Present (04/18/2019)   Archie    Feeling of Stress : To some extent  Social Connections: Unknown (04/18/2019)   Social Connection and Isolation Panel [NHANES]    Frequency of Communication with Friends and  Family: Patient refused    Frequency of Social Gatherings with Friends and Family: Patient refused    Attends Religious Services: Patient refused    Active Member of Clubs or Organizations: Patient refused    Attends Archivist Meetings: Patient refused    Marital Status: Patient refused  Intimate Partner Violence: Not At Risk (04/18/2019)   Humiliation, Afraid, Rape, and Kick questionnaire    Fear of Current or Ex-Partner: No    Emotionally Abused: No    Physically Abused: No    Sexually Abused: No    FAMILY HISTORY: Family History  Problem Relation Age of Onset   Varicose Veins Mother    ADD / ADHD Father    Alcohol abuse Father    Drug abuse Father    Anxiety disorder Father    Depression Father    ADD / ADHD Brother    Pancreatic cancer Paternal Uncle 24   Breast cancer Maternal Grandmother        late 60s-70s   Arthritis Paternal Grandmother    COPD Paternal Grandmother    Diabetes Paternal Grandmother    Breast cancer Paternal Grandmother 45   Cancer Paternal Grandmother        fallopian tube dx 107   Cancer - Other Paternal Grandmother        tongue cancer dx 22   Cervical cancer Paternal Aunt        dx 44s, genetic test + for colon gene   Colon cancer Other    Colon cancer Paternal Great-grandmother    Pancreatic cancer Other     ALLERGIES:  is allergic to bupropion.  MEDICATIONS:  Current Outpatient Medications  Medication Sig Dispense Refill   acetaminophen (TYLENOL) 500 MG tablet Take 500 mg by mouth every 6 (six) hours as needed for mild pain.     metoprolol succinate (TOPROL XL) 25 MG 24 hr tablet Take 0.5 tablets (12.5 mg total) by mouth daily. 45 tablet 3   SUMAtriptan (IMITREX) 25 MG tablet Take 1 tablet (25 mg total) by mouth 2 (two) times daily as needed, at least 2 hours between doses 20 tablet 5   No current facility-administered medications for this visit.    PHYSICAL EXAMINATION: ECOG PERFORMANCE STATUS: {CHL ONC ECOG  PS:[phone]}  There were no vitals filed for this visit. There were no vitals filed for this visit. *** GENERAL:alert, no distress and comfortable SKIN: skin color, texture, turgor are normal, no rashes or significant lesions EYES: normal, Conjunctiva are pink and non-injected, sclera clear {OROPHARYNX:no exudate, no erythema and lips, buccal mucosa, and tongue normal}  NECK: supple, thyroid normal size, non-tender, without nodularity LYMPH:  no palpable lymphadenopathy in the cervical, axillary {or inguinal} LUNGS: clear to auscultation and percussion with normal breathing effort HEART: regular rate & rhythm and no murmurs and no lower extremity edema ABDOMEN:abdomen soft, non-tender and normal  bowel sounds Musculoskeletal:no cyanosis of digits and no clubbing  NEURO: alert & oriented x 3 with fluent speech, no focal motor/sensory deficits  LABORATORY DATA:  I have reviewed the data as listed    Latest Ref Rng & Units 09/02/2021    6:40 AM 04/19/2019   10:51 AM 04/18/2019    6:20 PM  CBC  WBC 4.0 - 10.5 K/uL 7.1  11.7  10.4   Hemoglobin 12.0 - 15.0 g/dL 13.3  9.4  11.4   Hematocrit 36.0 - 46.0 % 40.1  28.3  33.6   Platelets 150 - 400 K/uL 259  187  195        Latest Ref Rng & Units 09/02/2021    6:40 AM 02/22/2014    4:22 AM 02/21/2014    9:20 AM  CMP  Glucose 70 - 99 mg/dL 139  90  89   BUN 6 - 20 mg/dL 14  8  9   $ Creatinine 0.44 - 1.00 mg/dL 0.93  0.66  0.60   Sodium 135 - 145 mmol/L 138  135  136   Potassium 3.5 - 5.1 mmol/L 3.3  3.3  3.6   Chloride 98 - 111 mmol/L 106  101  100   CO2 22 - 32 mmol/L 22  22  21   $ Calcium 8.9 - 10.3 mg/dL 9.4  9.3  9.6   Total Protein 6.5 - 8.1 g/dL 7.6  6.1    Total Bilirubin 0.3 - 1.2 mg/dL 0.5  0.4    Alkaline Phos 38 - 126 U/L 64  44    AST 15 - 41 U/L 20  20    ALT 0 - 44 U/L 16  10       RADIOGRAPHIC STUDIES: I have personally reviewed the radiological images as listed and agreed with the findings in the report. No results  found.  ASSESSMENT & PLAN:  HAIVYN BUCHWALD is a 32 y.o. *** female with a history of ***   1. ***      PLAN:  ***    No orders of the defined types were placed in this encounter.   All questions were answered. The patient knows to call the clinic with any problems, questions or concerns. The total time spent in the appointment was {CHL ONC TIME VISIT - [id number].     Baldemar Friday, CMA 08/31/2022 3:06 PM  I, Audry Riles, am acting as scribe for Truitt Merle, MD.   {Add scribe attestation statement}

## 2022-08-31 NOTE — Addendum Note (Signed)
Addended by: Truitt Merle on: 08/31/2022 05:56 PM   Modules accepted: Orders

## 2022-08-31 NOTE — Progress Notes (Signed)
Renee Sanchez   [phone] Fax:([phone]) [place] consult Note   Patient Care Team: Pa, [place] as PCP - General (Family Medicine) Jerline Pain, MD as PCP - Cardiology (Cardiology) 08/31/2022  CHIEF COMPLAINTS/PURPOSE OF CONSULTATION:  Low Ferritin  ASSESSMENT & PLAN:  Renee Sanchez is a 32 y.o.  female with a history of   Iron deficiency without anemia -Per patient, she has had iron deficiency related symptoms for many years, including fatigue, palpitation, dyspnea on exertion, insomnia and anxiety.  -Her recent lab showed ferritin 10, increased TIBC, consistent with iron deficiency.  She has normal CBC, no anemia or microcytosis. -Her iron deficiency is secondary to her menorrhagia -She has tried oral iron for many years, could not tolerate due to GI symptoms. -I recommend IV iron, will start her on Venofer 200 mg weekly for 5 weeks, benefit and side effects, especially allergy reaction, which could be severe as anaphylactic reaction, were discussed with her in detail.  She agrees to proceed.  Will see to see if her symptoms improved after adequate iv iron.  -Will repeat CBC and iron study in 4 weeks -Lab and follow-up in 3 months.  PLAN: -Lab reviewed  -Discuss IV Iron and its benefit and side effects -Venofer 200 mg weekly x5   -repeat lab in  5 wks -f/u in 3 months    HISTORY OF PRESENTING ILLNESS:  Renee Sanchez 32 y.o. female is here because of low Ferritin . Pt state that she had some headache and SOB, Pt reports of irritability, anxiety. Pt states the fatigue is the biggest part. She state that she have some hair lost. Pt reports of heavy menstruals. Pt states she wants to be more active, but she is tired all the time.  She was found to have abnormal CBC from PCP lab work from August 17, 2022.  Her ferritin was 10.7, serum iron 81, TIBC 511, CBC was normal with hemoglobin 13.4, normal MCV. She  has chronic  fatigue, although still able to do her routine activities and workout, she is able to do weight lifting, but not much cardiac workout due to the palpitation.  She has dyspnea on heavy exertion.  She was previous seen by cardiologist for that and cardiac workup were negative.  She also complains of insomnia, mild hair loss, no pain or weight loss. She had not noticed any recent bleeding such as epistaxis, hematuria or hematochezia The patient denies over the counter NSAID ingestion. She is not  on antiplatelets agents. She had no prior history or diagnosis of cancer. Her age appropriate screening programs are up-to-date. She denies any pica and eats a variety of diet. She never donated blood or received blood transfusion  She has tried oral iron intermittently, not able to tolerate well due to constipation, and GI upset.    REVIEW OF SYSTEMS:   Constitutional: Denies fevers, chills or abnormal night sweats Eyes: Denies blurriness of vision, double vision or watery eyes Ears, nose, mouth, throat, and face: Denies mucositis or sore throat Respiratory: Denies cough, dyspnea or wheezes Cardiovascular: Denies palpitation, chest discomfort or lower extremity swelling Gastrointestinal:  Denies nausea, heartburn or change in bowel habits Skin: Denies abnormal skin rashes Lymphatics: Denies new lymphadenopathy or easy bruising Neurological:Denies numbness, tingling or new weaknesses Behavioral/Psych: Anxiety ,irritability Mood i, no new changes   All other systems were reviewed with the patient and are negative.   MEDICAL HISTORY:  Past Medical  History:  Diagnosis Date   ADD (attention deficit disorder)    Amenorrhea    Anemia    Anxiety    Chronic fatigue    Depression    Family history of breast cancer    Family history of colon cancer    Family history of ovarian cancer    Family history of pancreatic cancer    GERD (gastroesophageal reflux disease)    History of kidney stones     Insomnia    Kidney stone 09/02/2021   Late menses    Low iron    Missed period    Nephrolithiasis    Normal intrauterine pregnancy in third trimester 08/25/2014   Palpitations    Panic disorder    Retained products of conception, early pregnancy 01/17/2017    SURGICAL HISTORY: Past Surgical History:  Procedure Laterality Date   DILATION AND CURETTAGE OF UTERUS  01/14/2017   kidney stent     kidney stent removal and removal of kidney stone      SOCIAL HISTORY: Social History   Socioeconomic History   Marital status: Married    Spouse name: Lovena Le   Number of children: 2   Years of education: Not on file   Highest education level: Not on file  Occupational History   Not on file  Tobacco Use   Smoking status: Former   Smokeless tobacco: Never  Substance and Sexual Activity   Alcohol use: Not Currently    Comment: social   Drug use: No   Sexual activity: Yes    Birth control/protection: None  Other Topics Concern   Not on file  Social History Narrative   Not on file   Social Determinants of Health   Financial Resource Strain: Low Risk  (04/18/2019)   Overall Financial Resource Strain (CARDIA)    Difficulty of Paying Living Expenses: Not hard at all  Food Insecurity: No Food Insecurity (04/18/2019)   Hunger Vital Sign    Worried About Running Out of Food in the Last Year: Never true    [place] in the Last Year: Never true  Transportation Needs: No Transportation Needs (04/18/2019)   PRAPARE - Hydrologist (Medical): No    Lack of Transportation (Non-Medical): No  Physical Activity: Unknown (04/18/2019)   Exercise Vital Sign    Days of Exercise per Week: Patient refused    Minutes of Exercise per Session: Patient refused  Stress: Stress Concern Present (04/18/2019)   [place]    Feeling of Stress : To some extent  Social Connections: Unknown (04/18/2019)    Social Connection and Isolation Panel [NHANES]    Frequency of Communication with Friends and Family: Patient refused    Frequency of Social Gatherings with Friends and Family: Patient refused    Attends Religious Services: Patient refused    Active Member of Clubs or Organizations: Patient refused    Attends Archivist Meetings: Patient refused    Marital Status: Patient refused  Intimate Partner Violence: Not At Risk (04/18/2019)   Humiliation, Afraid, Rape, and Kick questionnaire    Fear of Current or Ex-Partner: No    Emotionally Abused: No    Physically Abused: No    Sexually Abused: No    FAMILY HISTORY: Family History  Problem Relation Age of Onset   Varicose Veins Mother    ADD / ADHD Father    Alcohol abuse Father    Drug  abuse Father    Anxiety disorder Father    Depression Father    ADD / ADHD Brother    Pancreatic cancer Paternal Uncle 23   Breast cancer Maternal Grandmother        late 60s-70s   Arthritis Paternal Grandmother    COPD Paternal Grandmother    Diabetes Paternal Grandmother    Breast cancer Paternal Grandmother 80   Cancer Paternal Grandmother        fallopian tube dx 30   Cancer - Other Paternal Grandmother        tongue cancer dx 23   Cervical cancer Paternal Aunt        dx 9s, genetic test + for colon gene   Colon cancer Other    Colon cancer Paternal Great-grandmother    Pancreatic cancer Other     ALLERGIES:  is allergic to bupropion.  MEDICATIONS:  Current Outpatient Medications  Medication Sig Dispense Refill   acetaminophen (TYLENOL) 500 MG tablet Take 500 mg by mouth every 6 (six) hours as needed for mild pain.     SUMAtriptan (IMITREX) 25 MG tablet Take 1 tablet (25 mg total) by mouth 2 (two) times daily as needed, at least 2 hours between doses 20 tablet 5   No current facility-administered medications for this visit.    PHYSICAL EXAMINATION: ECOG PERFORMANCE STATUS: 1 - Symptomatic but completely  ambulatory  Vitals:   08/31/22 1602  BP: 129/71  Pulse: 65  Resp: 14  Temp: 97.8 F (36.6 C)  SpO2: 98%   Filed Weights   08/31/22 1602  Weight: 164 lb 14.4 oz (74.8 kg)    GENERAL:alert, no distress and comfortable SKIN: skin color, texture, turgor are normal, no rashes or significant lesions EYES: normal, conjunctiva are pink and non-injected, sclera clear NECK:(-) supple, thyroid normal size, non-tender, without nodularity LYMPH: (-)  no palpable lymphadenopathy in the cervical, axillary or inguinal LUNGS:(-) clear to auscultation and percussion with normal breathing effort HEART:(-)  regular rate & rhythm and no murmurs and no lower extremity edema ABDOMEN:(-) abdomen soft, non-tender and normal bowel sounds Liver ans spleen is normal.  LABORATORY DATA:  I have reviewed the data as listed    Latest Ref Rng & Units 09/02/2021    6:40 AM 04/19/2019   10:51 AM 04/18/2019    6:20 PM  CBC  WBC 4.0 - 10.5 K/uL 7.1  11.7  10.4   Hemoglobin 12.0 - 15.0 g/dL 13.3  9.4  11.4   Hematocrit 36.0 - 46.0 % 40.1  28.3  33.6   Platelets 150 - 400 K/uL 259  187  195        Latest Ref Rng & Units 09/02/2021    6:40 AM 02/22/2014    4:22 AM 02/21/2014    9:20 AM  CMP  Glucose 70 - 99 mg/dL 139  90  89   BUN 6 - 20 mg/dL 14  8  9   $ Creatinine 0.44 - 1.00 mg/dL 0.93  0.66  0.60   Sodium 135 - 145 mmol/L 138  135  136   Potassium 3.5 - 5.1 mmol/L 3.3  3.3  3.6   Chloride 98 - 111 mmol/L 106  101  100   CO2 22 - 32 mmol/L 22  22  21   $ Calcium 8.9 - 10.3 mg/dL 9.4  9.3  9.6   Total Protein 6.5 - 8.1 g/dL 7.6  6.1    Total Bilirubin 0.3 - 1.2 mg/dL 0.5  0.4  Alkaline Phos 38 - 126 U/L 64  44    AST 15 - 41 U/L 20  20    ALT 0 - 44 U/L 16  10       RADIOGRAPHIC STUDIES: I have personally reviewed the radiological images as listed and agreed with the findings in the report. No results found.  Orders Placed This Encounter  Procedures   CBC with Differential/Platelet    Standing  Status:   Standing    Number of Occurrences:   50    Standing Expiration Date:   09/01/2023   Ferritin    Standing Status:   Standing    Number of Occurrences:   20    Standing Expiration Date:   09/01/2023    All questions were answered. The patient knows to call the clinic with any problems, questions or concerns. The total time spent in the appointment was 30 minutes.     Truitt Merle, MD 08/31/2022   Felicity Coyer  am acting as scribe for Truitt Merle, MD.   I have reviewed the above documentation for accuracy and completeness, and I agree with the above.

## 2022-09-03 ENCOUNTER — Telehealth: Payer: Self-pay | Admitting: Hematology

## 2022-09-03 NOTE — Telephone Encounter (Signed)
Contacted patient to scheduled appointments. Patient is aware of appointments that are scheduled.   

## 2022-09-06 DIAGNOSIS — H3530 Unspecified macular degeneration: Secondary | ICD-10-CM | POA: Diagnosis not present

## 2022-09-06 DIAGNOSIS — H31092 Other chorioretinal scars, left eye: Secondary | ICD-10-CM | POA: Diagnosis not present

## 2022-09-06 DIAGNOSIS — H35352 Cystoid macular degeneration, left eye: Secondary | ICD-10-CM | POA: Diagnosis not present

## 2022-09-10 ENCOUNTER — Other Ambulatory Visit: Payer: Self-pay

## 2022-09-10 ENCOUNTER — Inpatient Hospital Stay: Payer: 59

## 2022-09-10 VITALS — BP 131/64 | HR 68 | Temp 98.1°F | Resp 18

## 2022-09-10 DIAGNOSIS — D500 Iron deficiency anemia secondary to blood loss (chronic): Secondary | ICD-10-CM

## 2022-09-10 DIAGNOSIS — N920 Excessive and frequent menstruation with regular cycle: Secondary | ICD-10-CM | POA: Diagnosis not present

## 2022-09-10 DIAGNOSIS — H442A2 Degenerative myopia with choroidal neovascularization, left eye: Secondary | ICD-10-CM | POA: Diagnosis not present

## 2022-09-10 DIAGNOSIS — E611 Iron deficiency: Secondary | ICD-10-CM | POA: Diagnosis not present

## 2022-09-10 DIAGNOSIS — H31012 Macula scars of posterior pole (postinflammatory) (post-traumatic), left eye: Secondary | ICD-10-CM | POA: Diagnosis not present

## 2022-09-10 MED ORDER — SODIUM CHLORIDE 0.9 % IV SOLN
200.0000 mg | Freq: Once | INTRAVENOUS | Status: AC
  Administered 2022-09-10: 200 mg via INTRAVENOUS
  Filled 2022-09-10: qty 200

## 2022-09-10 MED ORDER — SODIUM CHLORIDE 0.9 % IV SOLN: Freq: Once | INTRAVENOUS | Status: AC

## 2022-09-10 NOTE — Progress Notes (Signed)
Pt tolerated iron infusion well, vital signs stable, ambulatory at discharge.

## 2022-09-10 NOTE — Patient Instructions (Signed)

## 2022-09-17 ENCOUNTER — Inpatient Hospital Stay: Payer: 59

## 2022-09-17 ENCOUNTER — Other Ambulatory Visit: Payer: Self-pay

## 2022-09-17 VITALS — BP 122/66 | HR 60 | Temp 98.0°F | Resp 16

## 2022-09-17 DIAGNOSIS — N920 Excessive and frequent menstruation with regular cycle: Secondary | ICD-10-CM | POA: Diagnosis not present

## 2022-09-17 DIAGNOSIS — E611 Iron deficiency: Secondary | ICD-10-CM | POA: Diagnosis not present

## 2022-09-17 DIAGNOSIS — D500 Iron deficiency anemia secondary to blood loss (chronic): Secondary | ICD-10-CM

## 2022-09-17 MED ORDER — SODIUM CHLORIDE 0.9 % IV SOLN: Freq: Once | INTRAVENOUS | Status: AC

## 2022-09-17 MED ORDER — SODIUM CHLORIDE 0.9 % IV SOLN
200.0000 mg | Freq: Once | INTRAVENOUS | Status: AC
  Administered 2022-09-17: 200 mg via INTRAVENOUS
  Filled 2022-09-17: qty 200

## 2022-09-17 NOTE — Patient Instructions (Signed)

## 2022-09-17 NOTE — Progress Notes (Signed)
Patient remained for 30 min post obs. VSS at discharge, tolerated treatment well.  Patient ambulated to lobby.

## 2022-09-24 ENCOUNTER — Inpatient Hospital Stay: Payer: 59 | Attending: Hematology

## 2022-09-24 VITALS — BP 110/64 | HR 69 | Temp 98.0°F | Resp 17

## 2022-09-24 DIAGNOSIS — E611 Iron deficiency: Secondary | ICD-10-CM | POA: Diagnosis not present

## 2022-09-24 DIAGNOSIS — D500 Iron deficiency anemia secondary to blood loss (chronic): Secondary | ICD-10-CM

## 2022-09-24 MED ORDER — SODIUM CHLORIDE 0.9 % IV SOLN: Freq: Once | INTRAVENOUS | Status: AC

## 2022-09-24 MED ORDER — SODIUM CHLORIDE 0.9 % IV SOLN
200.0000 mg | Freq: Once | INTRAVENOUS | Status: AC
  Administered 2022-09-24: 200 mg via INTRAVENOUS
  Filled 2022-09-24: qty 200

## 2022-10-01 ENCOUNTER — Inpatient Hospital Stay: Payer: 59

## 2022-10-01 VITALS — BP 111/63 | HR 66 | Temp 97.9°F | Resp 18

## 2022-10-01 DIAGNOSIS — E611 Iron deficiency: Secondary | ICD-10-CM | POA: Diagnosis not present

## 2022-10-01 DIAGNOSIS — D500 Iron deficiency anemia secondary to blood loss (chronic): Secondary | ICD-10-CM

## 2022-10-01 MED ORDER — SODIUM CHLORIDE 0.9 % IV SOLN: Freq: Once | INTRAVENOUS | Status: AC

## 2022-10-01 MED ORDER — SODIUM CHLORIDE 0.9 % IV SOLN
200.0000 mg | Freq: Once | INTRAVENOUS | Status: AC
  Administered 2022-10-01: 200 mg via INTRAVENOUS
  Filled 2022-10-01: qty 200

## 2022-10-01 NOTE — Progress Notes (Signed)
Pt tolerated Venofer infusion well. Declined to stay 30 minute observation. Vital signs stable, ambulatory at discharge.

## 2022-10-01 NOTE — Patient Instructions (Signed)

## 2022-10-08 ENCOUNTER — Inpatient Hospital Stay: Payer: 59

## 2022-10-10 ENCOUNTER — Inpatient Hospital Stay: Payer: 59

## 2022-10-10 VITALS — BP 122/67 | HR 75 | Temp 97.9°F | Resp 16

## 2022-10-10 DIAGNOSIS — E611 Iron deficiency: Secondary | ICD-10-CM | POA: Diagnosis not present

## 2022-10-10 DIAGNOSIS — H442A2 Degenerative myopia with choroidal neovascularization, left eye: Secondary | ICD-10-CM | POA: Diagnosis not present

## 2022-10-10 DIAGNOSIS — D500 Iron deficiency anemia secondary to blood loss (chronic): Secondary | ICD-10-CM

## 2022-10-10 MED ORDER — SODIUM CHLORIDE 0.9 % IV SOLN
200.0000 mg | Freq: Once | INTRAVENOUS | Status: AC
  Administered 2022-10-10: 200 mg via INTRAVENOUS
  Filled 2022-10-10: qty 200

## 2022-10-10 MED ORDER — SODIUM CHLORIDE 0.9 % IV SOLN: Freq: Once | INTRAVENOUS | Status: AC

## 2022-10-15 ENCOUNTER — Inpatient Hospital Stay: Payer: 59

## 2022-10-16 ENCOUNTER — Inpatient Hospital Stay: Payer: 59

## 2022-10-16 ENCOUNTER — Other Ambulatory Visit: Payer: Self-pay

## 2022-10-16 DIAGNOSIS — E611 Iron deficiency: Secondary | ICD-10-CM | POA: Diagnosis not present

## 2022-10-16 DIAGNOSIS — D500 Iron deficiency anemia secondary to blood loss (chronic): Secondary | ICD-10-CM

## 2022-10-16 LAB — CBC WITH DIFFERENTIAL/PLATELET
Abs Immature Granulocytes: 0.01 10*3/uL (ref 0.00–0.07)
Basophils Absolute: 0 10*3/uL (ref 0.0–0.1)
Basophils Relative: 0 %
Eosinophils Absolute: 0.3 10*3/uL (ref 0.0–0.5)
Eosinophils Relative: 3 %
HCT: 40.2 % (ref 36.0–46.0)
Hemoglobin: 13.6 g/dL (ref 12.0–15.0)
Immature Granulocytes: 0 %
Lymphocytes Relative: 21 %
Lymphs Abs: 1.6 10*3/uL (ref 0.7–4.0)
MCH: 30.3 pg (ref 26.0–34.0)
MCHC: 33.8 g/dL (ref 30.0–36.0)
MCV: 89.5 fL (ref 80.0–100.0)
Monocytes Absolute: 0.4 10*3/uL (ref 0.1–1.0)
Monocytes Relative: 5 %
Neutro Abs: 5.4 10*3/uL (ref 1.7–7.7)
Neutrophils Relative %: 71 %
Platelets: 219 10*3/uL (ref 150–400)
RBC: 4.49 MIL/uL (ref 3.87–5.11)
RDW: 13.8 % (ref 11.5–15.5)
WBC: 7.7 10*3/uL (ref 4.0–10.5)
nRBC: 0 % (ref 0.0–0.2)

## 2022-10-16 LAB — FERRITIN: Ferritin: 195 ng/mL (ref 11–307)

## 2022-10-27 ENCOUNTER — Encounter: Payer: Self-pay | Admitting: Hematology

## 2022-11-07 DIAGNOSIS — H442A2 Degenerative myopia with choroidal neovascularization, left eye: Secondary | ICD-10-CM | POA: Diagnosis not present

## 2022-11-08 DIAGNOSIS — Z13220 Encounter for screening for lipoid disorders: Secondary | ICD-10-CM | POA: Diagnosis not present

## 2022-11-08 DIAGNOSIS — E559 Vitamin D deficiency, unspecified: Secondary | ICD-10-CM | POA: Diagnosis not present

## 2022-11-08 DIAGNOSIS — Z1331 Encounter for screening for depression: Secondary | ICD-10-CM | POA: Diagnosis not present

## 2022-11-08 DIAGNOSIS — Z0000 Encounter for general adult medical examination without abnormal findings: Secondary | ICD-10-CM | POA: Diagnosis not present

## 2022-11-08 DIAGNOSIS — Z131 Encounter for screening for diabetes mellitus: Secondary | ICD-10-CM | POA: Diagnosis not present

## 2022-11-08 DIAGNOSIS — D509 Iron deficiency anemia, unspecified: Secondary | ICD-10-CM | POA: Diagnosis not present

## 2022-12-10 ENCOUNTER — Inpatient Hospital Stay: Payer: 59 | Attending: Hematology

## 2022-12-10 ENCOUNTER — Other Ambulatory Visit: Payer: Self-pay

## 2022-12-10 DIAGNOSIS — N920 Excessive and frequent menstruation with regular cycle: Secondary | ICD-10-CM | POA: Diagnosis not present

## 2022-12-10 DIAGNOSIS — D500 Iron deficiency anemia secondary to blood loss (chronic): Secondary | ICD-10-CM

## 2022-12-10 LAB — CBC WITH DIFFERENTIAL/PLATELET
Abs Immature Granulocytes: 0.03 10*3/uL (ref 0.00–0.07)
Basophils Absolute: 0 10*3/uL (ref 0.0–0.1)
Basophils Relative: 1 %
Eosinophils Absolute: 0.3 10*3/uL (ref 0.0–0.5)
Eosinophils Relative: 5 %
HCT: 38.9 % (ref 36.0–46.0)
Hemoglobin: 13.4 g/dL (ref 12.0–15.0)
Immature Granulocytes: 1 %
Lymphocytes Relative: 25 %
Lymphs Abs: 1.6 10*3/uL (ref 0.7–4.0)
MCH: 30.9 pg (ref 26.0–34.0)
MCHC: 34.4 g/dL (ref 30.0–36.0)
MCV: 89.6 fL (ref 80.0–100.0)
Monocytes Absolute: 0.4 10*3/uL (ref 0.1–1.0)
Monocytes Relative: 6 %
Neutro Abs: 4.1 10*3/uL (ref 1.7–7.7)
Neutrophils Relative %: 62 %
Platelets: 208 10*3/uL (ref 150–400)
RBC: 4.34 MIL/uL (ref 3.87–5.11)
RDW: 12.7 % (ref 11.5–15.5)
WBC: 6.5 10*3/uL (ref 4.0–10.5)
nRBC: 0 % (ref 0.0–0.2)

## 2022-12-10 LAB — FERRITIN: Ferritin: 100 ng/mL (ref 11–307)

## 2022-12-13 ENCOUNTER — Encounter: Payer: Self-pay | Admitting: Hematology

## 2022-12-13 ENCOUNTER — Inpatient Hospital Stay (HOSPITAL_BASED_OUTPATIENT_CLINIC_OR_DEPARTMENT_OTHER): Payer: 59 | Admitting: Hematology

## 2022-12-13 DIAGNOSIS — D500 Iron deficiency anemia secondary to blood loss (chronic): Secondary | ICD-10-CM

## 2022-12-13 NOTE — Assessment & Plan Note (Signed)
-  Per patient, she has had iron deficiency related symptoms for many years, including fatigue, palpitation, dyspnea on exertion, insomnia and anxiety.  -Her recent lab showed ferritin 10, increased TIBC, consistent with iron deficiency.  She has normal CBC, no anemia or microcytosis. -Her iron deficiency is secondary to her menorrhagia -She has tried oral iron for many years, could not tolerate due to GI symptoms. -She received IV iron Venofer for total of 1 g in February and March 2024. -Repeated lab last week showed normal CBC, ferritin 100.

## 2022-12-13 NOTE — Progress Notes (Signed)
[facility]   Telephone:(336) [phone] Fax:(336) [phone]   Clinic Follow up Note   Patient Care Team: [facility] as PCP - General (Family Medicine) Jake Bathe, MD as PCP - Cardiology (Cardiology)  Date of Service:  12/13/2022  I connected with Renee Sanchez on 12/13/2022 at  1:20 PM EDT by telephone visit and verified that I am speaking with the correct person using two identifiers.  I discussed the limitations, risks, security and privacy concerns of performing an evaluation and management service by telephone and the availability of in person appointments. I also discussed with the patient that there may be a patient responsible charge related to this service. The patient expressed understanding and agreed to proceed.   Other persons participating in the visit and their role in the encounter:  no  Patient's location:  Home Provider's location:  [facility] Office  CHIEF COMPLAINT: f/u of Low Ferritin   CURRENT THERAPY:  Venofer 400 mg   ASSESSMENT & PLAN:  Renee Sanchez is a 32 y.o. female with    Iron deficiency anemia due to chronic blood loss -Per patient, she has had iron deficiency related symptoms for many years, including fatigue, palpitation, dyspnea on exertion, insomnia and anxiety.  -Her recent lab showed ferritin 10, increased TIBC, consistent with iron deficiency.  She has normal CBC, no anemia or microcytosis. -Her iron deficiency is secondary to her menorrhagia -She has tried oral iron for many years, could not tolerate due to GI symptoms. -She received IV iron Venofer for total of 1 g in February and March 2024.  Repeated blood count was 190.  Her headaches have resolved after IV iron, and energy level also improved.  -Repeated lab last week showed normal CBC, ferritin 100.  She noticed fatigue and headaches regularly, will give her IV Feraheme 400 mg next visit.  PLAN: -recommend trying higher dose  IV Iron Venofer 400 mg  once next week -referral to [facility] for upper endoscopy -lab  q6 weeks and f/u in 6 months, will give IV Venofer 400-800 if ferritin less than 150 due to her symptoms      INTERVAL HISTORY:  Renee Sanchez was contacted for a follow up of Low Ferritin . She was last seen by me on 08/31/2022. Pt state that she notice a different since having IV iron. She had  increase energy and less migraines.   All other systems were reviewed with the patient and are negative.  MEDICAL HISTORY:  Past Medical History:  Diagnosis Date   ADD (attention deficit disorder)    Amenorrhea    Anemia    Anxiety    Chronic fatigue    Depression    Family history of breast cancer    Family history of colon cancer    Family history of ovarian cancer    Family history of pancreatic cancer    GERD (gastroesophageal reflux disease)    History of kidney stones    Insomnia    Kidney stone 09/02/2021   Late menses    Low iron    Missed period    Nephrolithiasis    Normal intrauterine pregnancy in third trimester 08/25/2014   Palpitations    Panic disorder    Retained products of conception, early pregnancy 01/17/2017    SURGICAL HISTORY: Past Surgical History:  Procedure Laterality Date   DILATION AND CURETTAGE OF UTERUS  01/14/2017   kidney stent     kidney stent  removal and removal of kidney stone      I have reviewed the social history and family history with the patient and they are unchanged from previous note.  ALLERGIES:  is allergic to bupropion.  MEDICATIONS:  Current Outpatient Medications  Medication Sig Dispense Refill   acetaminophen (TYLENOL) 500 MG tablet Take 500 mg by mouth every 6 (six) hours as needed for mild pain.     SUMAtriptan (IMITREX) 25 MG tablet Take 1 tablet (25 mg total) by mouth 2 (two) times daily as needed, at least 2 hours between doses 20 tablet 5   No current facility-administered medications for this visit.    PHYSICAL EXAMINATION: ECOG PERFORMANCE  STATUS: 0 - Asymptomatic  There were no vitals filed for this visit. Wt Readings from Last 3 Encounters:  08/31/22 164 lb 14.4 oz (74.8 kg)  09/14/21 178 lb (80.7 kg)  09/04/21 179 lb 14.3 oz (81.6 kg)     No vitals taken today, Exam not performed today  LABORATORY DATA:  I have reviewed the data as listed    Latest Ref Rng & Units 12/10/2022    8:00 AM 10/16/2022    1:34 PM 09/02/2021    6:40 AM  CBC  WBC 4.0 - 10.5 K/uL 6.5  7.7  7.1   Hemoglobin 12.0 - 15.0 g/dL 16.1  09.6  04.5   Hematocrit 36.0 - 46.0 % 38.9  40.2  40.1   Platelets 150 - 400 K/uL 208  219  259         Latest Ref Rng & Units 09/02/2021    6:40 AM 02/22/2014    4:22 AM 02/21/2014    9:20 AM  CMP  Glucose 70 - 99 mg/dL 409  90  89   BUN 6 - 20 mg/dL 14  8  9    Creatinine 0.44 - 1.00 mg/dL 8.11  9.14  7.82   Sodium 135 - 145 mmol/L 138  135  136   Potassium 3.5 - 5.1 mmol/L 3.3  3.3  3.6   Chloride 98 - 111 mmol/L 106  101  100   CO2 22 - 32 mmol/L 22  22  21    Calcium 8.9 - 10.3 mg/dL 9.4  9.3  9.6   Total Protein 6.5 - 8.1 g/dL 7.6  6.1    Total Bilirubin 0.3 - 1.2 mg/dL 0.5  0.4    Alkaline Phos 38 - 126 U/L 64  44    AST 15 - 41 U/L 20  20    ALT 0 - 44 U/L 16  10        RADIOGRAPHIC STUDIES: I have personally reviewed the radiological images as listed and agreed with the findings in the report. No results found.    Orders Placed This Encounter  Procedures   Ambulatory referral to Gastroenterology    Referral Priority:   Routine    Referral Type:   Consultation    Referral Reason:   Specialty Services Required    Number of Visits Requested:   1   All questions were answered. The patient knows to call the clinic with any problems, questions or concerns. No barriers to learning was detected. The total time spent in the appointment was 15 minutes.     Renee Mood, MD 12/13/2022   Carolin Coy am acting as scribe for Renee Mood, MD.   I have reviewed the above documentation for  accuracy and completeness, and I agree with the above.

## 2022-12-14 ENCOUNTER — Telehealth: Payer: Self-pay | Admitting: Hematology

## 2022-12-14 NOTE — Telephone Encounter (Signed)
Contacted patient to scheduled appointments. Patient is aware of appointments that are scheduled.   

## 2022-12-24 ENCOUNTER — Encounter: Payer: Self-pay | Admitting: Gastroenterology

## 2022-12-25 ENCOUNTER — Inpatient Hospital Stay: Payer: 59 | Attending: Hematology

## 2022-12-25 VITALS — BP 111/59 | HR 62 | Temp 97.9°F | Resp 17

## 2022-12-25 DIAGNOSIS — D500 Iron deficiency anemia secondary to blood loss (chronic): Secondary | ICD-10-CM | POA: Insufficient documentation

## 2022-12-25 DIAGNOSIS — N920 Excessive and frequent menstruation with regular cycle: Secondary | ICD-10-CM | POA: Diagnosis not present

## 2022-12-25 MED ORDER — SODIUM CHLORIDE 0.9 % IV SOLN: Freq: Once | INTRAVENOUS | Status: AC

## 2022-12-25 MED ORDER — SODIUM CHLORIDE 0.9 % IV SOLN
400.0000 mg | Freq: Once | INTRAVENOUS | Status: AC
  Administered 2022-12-25: 400 mg via INTRAVENOUS
  Filled 2022-12-25: qty 400

## 2022-12-25 NOTE — Patient Instructions (Signed)

## 2023-01-01 ENCOUNTER — Other Ambulatory Visit: Payer: Self-pay

## 2023-01-01 ENCOUNTER — Encounter: Payer: Self-pay | Admitting: Hematology

## 2023-01-01 DIAGNOSIS — D649 Anemia, unspecified: Secondary | ICD-10-CM

## 2023-01-01 DIAGNOSIS — D500 Iron deficiency anemia secondary to blood loss (chronic): Secondary | ICD-10-CM

## 2023-01-28 ENCOUNTER — Telehealth: Payer: Self-pay | Admitting: Hematology

## 2023-01-28 ENCOUNTER — Inpatient Hospital Stay: Payer: 59

## 2023-01-29 ENCOUNTER — Inpatient Hospital Stay: Payer: 59 | Attending: Hematology

## 2023-01-29 ENCOUNTER — Other Ambulatory Visit: Payer: Self-pay

## 2023-01-29 DIAGNOSIS — N920 Excessive and frequent menstruation with regular cycle: Secondary | ICD-10-CM | POA: Diagnosis not present

## 2023-01-29 DIAGNOSIS — D500 Iron deficiency anemia secondary to blood loss (chronic): Secondary | ICD-10-CM | POA: Diagnosis not present

## 2023-01-29 LAB — CBC WITH DIFFERENTIAL/PLATELET
Abs Immature Granulocytes: 0.02 10*3/uL (ref 0.00–0.07)
Basophils Absolute: 0 10*3/uL (ref 0.0–0.1)
Basophils Relative: 1 %
Eosinophils Absolute: 0.2 10*3/uL (ref 0.0–0.5)
Eosinophils Relative: 3 %
HCT: 40.8 % (ref 36.0–46.0)
Hemoglobin: 14.1 g/dL (ref 12.0–15.0)
Immature Granulocytes: 0 %
Lymphocytes Relative: 21 %
Lymphs Abs: 1.2 10*3/uL (ref 0.7–4.0)
MCH: 31.7 pg (ref 26.0–34.0)
MCHC: 34.6 g/dL (ref 30.0–36.0)
MCV: 91.7 fL (ref 80.0–100.0)
Monocytes Absolute: 0.6 10*3/uL (ref 0.1–1.0)
Monocytes Relative: 11 %
Neutro Abs: 3.8 10*3/uL (ref 1.7–7.7)
Neutrophils Relative %: 64 %
Platelets: 186 10*3/uL (ref 150–400)
RBC: 4.45 MIL/uL (ref 3.87–5.11)
RDW: 12 % (ref 11.5–15.5)
WBC: 5.9 10*3/uL (ref 4.0–10.5)
nRBC: 0 % (ref 0.0–0.2)

## 2023-01-29 LAB — IRON AND IRON BINDING CAPACITY (CC-WL,HP ONLY)
Iron: 90 ug/dL (ref 28–170)
Saturation Ratios: 26 % (ref 10.4–31.8)
TIBC: 351 ug/dL (ref 250–450)
UIBC: 261 ug/dL (ref 148–442)

## 2023-01-29 LAB — FERRITIN: Ferritin: 157 ng/mL (ref 11–307)

## 2023-02-05 ENCOUNTER — Encounter: Payer: Self-pay | Admitting: Hematology

## 2023-02-13 DIAGNOSIS — H442A2 Degenerative myopia with choroidal neovascularization, left eye: Secondary | ICD-10-CM | POA: Diagnosis not present

## 2023-03-08 ENCOUNTER — Other Ambulatory Visit: Payer: Self-pay

## 2023-03-08 ENCOUNTER — Inpatient Hospital Stay: Payer: 59 | Attending: Hematology

## 2023-03-08 DIAGNOSIS — N920 Excessive and frequent menstruation with regular cycle: Secondary | ICD-10-CM | POA: Insufficient documentation

## 2023-03-08 DIAGNOSIS — D500 Iron deficiency anemia secondary to blood loss (chronic): Secondary | ICD-10-CM | POA: Insufficient documentation

## 2023-03-08 LAB — CBC WITH DIFFERENTIAL/PLATELET
Abs Immature Granulocytes: 0.02 10*3/uL (ref 0.00–0.07)
Basophils Absolute: 0 10*3/uL (ref 0.0–0.1)
Basophils Relative: 1 %
Eosinophils Absolute: 0.4 10*3/uL (ref 0.0–0.5)
Eosinophils Relative: 6 %
HCT: 39.4 % (ref 36.0–46.0)
Hemoglobin: 13.7 g/dL (ref 12.0–15.0)
Immature Granulocytes: 0 %
Lymphocytes Relative: 24 %
Lymphs Abs: 1.5 10*3/uL (ref 0.7–4.0)
MCH: 31.4 pg (ref 26.0–34.0)
MCHC: 34.8 g/dL (ref 30.0–36.0)
MCV: 90.4 fL (ref 80.0–100.0)
Monocytes Absolute: 0.5 10*3/uL (ref 0.1–1.0)
Monocytes Relative: 7 %
Neutro Abs: 3.9 10*3/uL (ref 1.7–7.7)
Neutrophils Relative %: 62 %
Platelets: 208 10*3/uL (ref 150–400)
RBC: 4.36 MIL/uL (ref 3.87–5.11)
RDW: 11.7 % (ref 11.5–15.5)
WBC: 6.2 10*3/uL (ref 4.0–10.5)
nRBC: 0 % (ref 0.0–0.2)

## 2023-03-08 LAB — FERRITIN: Ferritin: 134 ng/mL (ref 11–307)

## 2023-03-11 ENCOUNTER — Telehealth: Payer: Self-pay

## 2023-03-11 ENCOUNTER — Other Ambulatory Visit: Payer: Self-pay

## 2023-03-12 ENCOUNTER — Telehealth: Payer: Self-pay | Admitting: Hematology

## 2023-03-12 ENCOUNTER — Encounter: Payer: Self-pay | Admitting: Hematology

## 2023-03-12 NOTE — Telephone Encounter (Signed)
Error. Melanie M Rodgers, RN  

## 2023-03-22 ENCOUNTER — Inpatient Hospital Stay: Payer: 59

## 2023-03-22 VITALS — BP 117/71 | HR 61 | Temp 98.5°F | Resp 16

## 2023-03-22 DIAGNOSIS — D500 Iron deficiency anemia secondary to blood loss (chronic): Secondary | ICD-10-CM

## 2023-03-22 MED ORDER — SODIUM CHLORIDE 0.9 % IV SOLN: Freq: Once | INTRAVENOUS | Status: AC

## 2023-03-22 MED ORDER — SODIUM CHLORIDE 0.9 % IV SOLN
400.0000 mg | Freq: Once | INTRAVENOUS | Status: AC
  Administered 2023-03-22: 400 mg via INTRAVENOUS
  Filled 2023-03-22: qty 20

## 2023-03-22 NOTE — Progress Notes (Signed)
 Patient declined 30 min post iron infusion observation.  Tolerated treatment well without incident.  VSS at discharge.  Ambulated to lobby.

## 2023-03-22 NOTE — Patient Instructions (Signed)
 Iron Sucrose Injection What is this medication? IRON SUCROSE (EYE ern SOO krose) treats low levels of iron (iron deficiency anemia) in people with kidney disease. Iron is a mineral that plays an important role in making red blood cells, which carry oxygen from your lungs to the rest of your body. This medicine may be used for other purposes; ask your health care provider or pharmacist if you have questions. COMMON BRAND NAME(S): Venofer What should I tell my care team before I take this medication? They need to know if you have any of these conditions: Anemia not caused by low iron levels Heart disease High levels of iron in the blood Kidney disease Liver disease An unusual or allergic reaction to iron, other medications, foods, dyes, or preservatives Pregnant or trying to get pregnant Breastfeeding How should I use this medication? This medication is for infusion into a vein. It is given in a hospital or clinic setting. Talk to your care team about the use of this medication in children. While this medication may be prescribed for children as young as 2 years for selected conditions, precautions do apply. Overdosage: If you think you have taken too much of this medicine contact a poison control center or emergency room at once. NOTE: This medicine is only for you. Do not share this medicine with others. What if I miss a dose? Keep appointments for follow-up doses. It is important not to miss your dose. Call your care team if you are unable to keep an appointment. What may interact with this medication? Do not take this medication with any of the following: Deferoxamine Dimercaprol Other iron products This medication may also interact with the following: Chloramphenicol Deferasirox This list may not describe all possible interactions. Give your health care provider a list of all the medicines, herbs, non-prescription drugs, or dietary supplements you use. Also tell them if you smoke,  drink alcohol, or use illegal drugs. Some items may interact with your medicine. What should I watch for while using this medication? Visit your care team regularly. Tell your care team if your symptoms do not start to get better or if they get worse. You may need blood work done while you are taking this medication. You may need to follow a special diet. Talk to your care team. Foods that contain iron include: whole grains/cereals, dried fruits, beans, or peas, leafy green vegetables, and organ meats (liver, kidney). What side effects may I notice from receiving this medication? Side effects that you should report to your care team as soon as possible: Allergic reactions--skin rash, itching, hives, swelling of the face, lips, tongue, or throat Low blood pressure--dizziness, feeling faint or lightheaded, blurry vision Shortness of breath Side effects that usually do not require medical attention (report to your care team if they continue or are bothersome): Flushing Headache Joint pain Muscle pain Nausea Pain, redness, or irritation at injection site This list may not describe all possible side effects. Call your doctor for medical advice about side effects. You may report side effects to FDA at 1-800-FDA-1088. Where should I keep my medication? This medication is given in a hospital or clinic. It will not be stored at home. NOTE: This sheet is a summary. It may not cover all possible information. If you have questions about this medicine, talk to your doctor, pharmacist, or health care provider.  2024 Elsevier/Gold Standard (2022-12-14 00:00:00)

## 2023-03-27 ENCOUNTER — Encounter: Payer: Self-pay | Admitting: Gastroenterology

## 2023-03-27 ENCOUNTER — Ambulatory Visit: Payer: 59 | Admitting: Gastroenterology

## 2023-03-27 VITALS — BP 136/82 | HR 70 | Ht 63.0 in | Wt 173.6 lb

## 2023-03-27 DIAGNOSIS — K219 Gastro-esophageal reflux disease without esophagitis: Secondary | ICD-10-CM

## 2023-03-27 DIAGNOSIS — D500 Iron deficiency anemia secondary to blood loss (chronic): Secondary | ICD-10-CM

## 2023-03-27 DIAGNOSIS — R1319 Other dysphagia: Secondary | ICD-10-CM

## 2023-03-27 DIAGNOSIS — R1310 Dysphagia, unspecified: Secondary | ICD-10-CM

## 2023-03-27 NOTE — Progress Notes (Signed)
HPI : Renee Sanchez is a 32 y.o. female who is referred to [place] by Malachy Mood, MD for further evaluation of iron deficiency anemia and GERD symptoms.  The patient thinks that she has had iron deficiency anemia for many years based on her symptoms, which include significant fatigue and migraines.  She recalls having a ferritin of 6 around 2020 and was told to oral iron.  An iron panel was performed last year and reportedly consistent with severe iron deficiency anemia.  She did not tolerated oral iron due to GI side effects and has been receiving IV under the care of Dr. Mosetta Putt. She feels much better since receiving the IV iron, with much more energy and no migraines.   Her ferritin has been consistently >100 since receiving IV iron with a goal of 150.   She report a history of heavy menses and she had 4 miscarriages in 1 year.  She has a long history of typical GERD symptoms, for at least 10 years or so.  Her symptoms consist of a burning pain in her chest and acid regurgitation.  She has symptoms on a daily basis, usually multiple times a day.  Symptoms have worsened in the past year.  Her symptoms are more prominent at night and she is frequently woken from sleep with heartburn or acid regurgitation (about twice a week on average).  Sometimes she experiences heartburn during a meal, and notes that bananas will reliable cause this.    She has had problems with intermittent dysphagia as well for many years.  She notes some foods, particularly breads, feel like they get stuck in her chest.  This usually will resolve with standing up or taking deep breaths.  She has had to induce vomiting on a few occasions.  Drinking water to wash it down does not help, and sometimes seems to make the discomfort worse.  No unintentional weight loss.  She did lose 20lbs pound through diet about a year ago, but regained about 10 lbs.  She denies any improvement in her GERD symptoms when her weight was down.  She typically  takes 2 Tums at bedtime for her GERD.  She has not tried taking any acid suppressing medications.  She has tried limiting fried and fatty foods as well, but admits that she has not made any drastic dietary changes for her GERD.  She denies any chronic lower GI symptoms other than occasional constipation and bloating.  No blood in the stool.  No diarrhea. She denies a history of asthma, eczema or other atopic disorders. No family history of celiac disease or atopic disorders.  She does have an extensive family history of cancer and she underwent genetic testing which did not reveal any mutations or markers.  She has distant relatives with colon cancer.  Her paternal grandmother had Barrett's esophagus and her mother struggles with GERD.   Past Medical History:  Diagnosis Date   ADD (attention deficit disorder)    Amenorrhea    Anemia    Anxiety    Chronic fatigue    Depression    Family history of breast cancer    Family history of colon cancer    Family history of ovarian cancer    Family history of pancreatic cancer    GERD (gastroesophageal reflux disease)    History of kidney stones    Insomnia    Kidney stone 09/02/2021   Late menses    Low iron    Missed period  Nephrolithiasis    Normal intrauterine pregnancy in third trimester 08/25/2014   Palpitations    Panic disorder    Retained products of conception, early pregnancy 01/17/2017     Past Surgical History:  Procedure Laterality Date   DILATION AND CURETTAGE OF UTERUS  01/14/2017   kidney stent     kidney stent removal and removal of kidney stone     Family History  Problem Relation Age of Onset   Varicose Veins Mother    ADD / ADHD Father    Alcohol abuse Father    Drug abuse Father    Anxiety disorder Father    Depression Father    ADD / ADHD Brother    Pancreatic cancer Paternal Uncle 53   Breast cancer Maternal Grandmother        late 60s-70s   Arthritis Paternal Grandmother    COPD Paternal  Grandmother    Diabetes Paternal Grandmother    Breast cancer Paternal Grandmother 20   Cancer Paternal Grandmother        fallopian tube dx 22   Cancer - Other Paternal Grandmother        tongue cancer dx 60   Cervical cancer Paternal Aunt        dx 51s, genetic test + for colon gene   Colon cancer Other    Colon cancer Paternal Great-grandmother    Pancreatic cancer Other    Social History   Tobacco Use   Smoking status: Former   Smokeless tobacco: Never  Substance Use Topics   Alcohol use: Not Currently    Comment: social   Drug use: No   Current Outpatient Medications  Medication Sig Dispense Refill   acetaminophen (TYLENOL) 500 MG tablet Take 500 mg by mouth every 6 (six) hours as needed for mild pain.     Cholecalciferol (VITAMIN D-1000 MAX ST) 25 MCG (1000 UT) tablet Take by mouth.     Multiple Vitamin (MULTI-VITAMIN) tablet Take 1 tablet by mouth daily.     SUMAtriptan (IMITREX) 25 MG tablet Take 1 tablet (25 mg total) by mouth 2 (two) times daily as needed, at least 2 hours between doses 20 tablet 5   No current facility-administered medications for this visit.   Allergies  Allergen Reactions   Bupropion      Review of Systems: All systems reviewed and negative except where noted in HPI.    No results found.  Physical Exam: BP 136/82   Pulse 70   Ht 5\' 3"  (1.6 m)   Wt 173 lb 9.6 oz (78.7 kg)   LMP 03/04/2023   BMI 30.75 kg/m  Constitutional: Pleasant,well-developed, Caucasian female in no acute distress. HEENT: Normocephalic and atraumatic. Conjunctivae are normal. No scleral icterus. Neck supple.  Cardiovascular: Normal rate, regular rhythm.  Pulmonary/chest: Effort normal and breath sounds normal. No wheezing, rales or rhonchi. Abdominal: Soft, nondistended, nontender. Bowel sounds active throughout. There are no masses palpable. No hepatomegaly. Extremities: no edema Neurological: Alert and oriented to person place and time. Skin: Skin is warm  and dry. No rashes noted. Psychiatric: Normal mood and affect. Behavior is normal.  CBC    Component Value Date/Time   WBC 6.2 03/08/2023 0823   RBC 4.36 03/08/2023 0823   HGB 13.7 03/08/2023 0823   HCT 39.4 03/08/2023 0823   PLT 208 03/08/2023 0823   MCV 90.4 03/08/2023 0823   MCH 31.4 03/08/2023 0823   MCHC 34.8 03/08/2023 0823   RDW 11.7 03/08/2023 0823   LYMPHSABS  1.5 03/08/2023 0823   MONOABS 0.5 03/08/2023 0823   EOSABS 0.4 03/08/2023 0823   BASOSABS 0.0 03/08/2023 0823    CMP     Component Value Date/Time   NA 138 09/02/2021 0640   K 3.3 (L) 09/02/2021 0640   CL 106 09/02/2021 0640   CO2 22 09/02/2021 0640   GLUCOSE 139 (H) 09/02/2021 0640   BUN 14 09/02/2021 0640   CREATININE 0.93 09/02/2021 0640   CALCIUM 9.4 09/02/2021 0640   PROT 7.6 09/02/2021 0640   ALBUMIN 4.2 09/02/2021 0640   AST 20 09/02/2021 0640   ALT 16 09/02/2021 0640   ALKPHOS 64 09/02/2021 0640   BILITOT 0.5 09/02/2021 0640   GFRNONAA >60 09/02/2021 0640   GFRAA >90 02/22/2014 0422       Latest Ref Rng & Units 03/08/2023    8:23 AM 01/29/2023    8:41 AM 12/10/2022    8:00 AM  CBC EXTENDED  WBC 4.0 - 10.5 K/uL 6.2  5.9  6.5   RBC 3.87 - 5.11 MIL/uL 4.36  4.45  4.34   Hemoglobin 12.0 - 15.0 g/dL 40.9  81.1  91.4   HCT 36.0 - 46.0 % 39.4  40.8  38.9   Platelets 150 - 400 K/uL 208  186  208   NEUT# 1.7 - 7.7 K/uL 3.9  3.8  4.1   Lymph# 0.7 - 4.0 K/uL 1.5  1.2  1.6       ASSESSMENT AND PLAN: 32 year old female with chronic iron deficiency anemia, likely secondary to menstrual blood loss, also with chronic frequent GERD symptoms and dysphagia.   We discussed the pathophysiology of GERD and the principles of GERD management to include lifestyle modifications  such as dietary discretion (avoidance of alcohol, tobacco, caffeinated and carbonated beverages, spicy/greasy foods, citrus, peppermint/chocolate), weight loss if applicable, head of bed elevation andconsuming last meal of day within 3  hours of bedtime; pharmacologic options to include PPIs, H2RAs and OTC antacids; and finally surgical or endoscopic fundoplication. Her dysphagia is most likely secondary to a peptic stricture/Schaztzki ring, but eosinophilic esophagitis is also very probably given her young age. An EGD is warranted to further evaluate her dysphagia and to assess for complications of GERD.   With her frequent GERD symptoms, a PPI is indicated, but will hold off until after EGD, to better assess the status of her esophagus off PPI initially. Given the chronicity of her iron deficiency and young age/heavy menses, it is very unlikely that her iron deficiency is secondary to blood loss from a mass lesion.  Therefore, I think a colonoscopy is very low yield. As she has other indications for an EGD (dysphagia), will also plan to exclude H. Pylori infection and celiac disease as possible causes of iron deficiency at the time of her EGD.  Dysphagia - EGD with esophageal biopsies and possible dilation  GERD - EGD - Start PPI after EGD  Iron deficiency anemia - Most likely secondary to menses - Will take biopsies at time of EGD to exclude H. Pylori infection and celiac disease.  The details, risks (including bleeding, perforation, infection, missed lesions, medication reactions and possible hospitalization or surgery if complications occur), benefits, and alternatives to EGD with possible biopsy and possible dilation were discussed with the patient and she consents to proceed.   Renee Sanchez E. Tomasa [name], MD [facility]   Malachy Mood, MD

## 2023-03-27 NOTE — Patient Instructions (Signed)
You have been scheduled for an endoscopy. Please follow written instructions given to you at your visit today.  If you use inhalers (even only as needed), please bring them with you on the day of your procedure.  If you take any of the following medications, they will need to be adjusted prior to your procedure:   DO NOT TAKE 7 DAYS PRIOR TO TEST- Trulicity (dulaglutide) Ozempic, Wegovy (semaglutide) Mounjaro (tirzepatide) Bydureon Bcise (exanatide extended release)  DO NOT TAKE 1 DAY PRIOR TO YOUR TEST Rybelsus (semaglutide) Adlyxin (lixisenatide) Victoza (liraglutide) Byetta (exanatide)   If your blood pressure at your visit was 140/90 or greater, please contact your primary care physician to follow up on this.  _______________________________________________________  If you are age 46 or older, your body mass index should be between 23-30. Your Body mass index is 30.75 kg/m. If this is out of the aforementioned range listed, please consider follow up with your Primary Care Provider.  If you are age 51 or younger, your body mass index should be between 19-25. Your Body mass index is 30.75 kg/m. If this is out of the aformentioned range listed, please consider follow up with your Primary Care Provider.   ________________________________________________________  The [facility] providers would like to encourage you to use [facility] to communicate with providers for non-urgent requests or questions.  Due to long hold times on the telephone, sending your provider a message by [facility] may be a faster and more efficient way to get a response.  Please allow 48 business hours for a response.  Please remember that this is for non-urgent requests.   It was a pleasure to see you today!  Thank you for trusting me with your gastrointestinal care!    Scott E.Tomasa [name], MD

## 2023-03-29 ENCOUNTER — Encounter: Payer: Self-pay | Admitting: Gastroenterology

## 2023-03-29 ENCOUNTER — Ambulatory Visit (AMBULATORY_SURGERY_CENTER): Payer: 59 | Admitting: Gastroenterology

## 2023-03-29 ENCOUNTER — Encounter: Payer: Self-pay | Admitting: Hematology

## 2023-03-29 ENCOUNTER — Other Ambulatory Visit (HOSPITAL_COMMUNITY): Payer: Self-pay

## 2023-03-29 VITALS — BP 127/68 | HR 94 | Temp 97.3°F | Resp 21 | Ht 63.0 in | Wt 173.0 lb

## 2023-03-29 DIAGNOSIS — K209 Esophagitis, unspecified without bleeding: Secondary | ICD-10-CM

## 2023-03-29 DIAGNOSIS — K2100 Gastro-esophageal reflux disease with esophagitis, without bleeding: Secondary | ICD-10-CM | POA: Diagnosis not present

## 2023-03-29 DIAGNOSIS — K219 Gastro-esophageal reflux disease without esophagitis: Secondary | ICD-10-CM | POA: Diagnosis not present

## 2023-03-29 DIAGNOSIS — K222 Esophageal obstruction: Secondary | ICD-10-CM | POA: Diagnosis not present

## 2023-03-29 DIAGNOSIS — K2950 Unspecified chronic gastritis without bleeding: Secondary | ICD-10-CM | POA: Diagnosis not present

## 2023-03-29 HISTORY — PX: UPPER GASTROINTESTINAL ENDOSCOPY: SHX188

## 2023-03-29 MED ORDER — PANTOPRAZOLE SODIUM 40 MG PO TBEC
40.0000 mg | DELAYED_RELEASE_TABLET | Freq: Two times a day (BID) | ORAL | 3 refills | Status: DC
Start: 2023-03-29 — End: 2024-02-04
  Filled 2023-03-29: qty 90, 45d supply, fill #0
  Filled 2023-07-20: qty 90, 45d supply, fill #1

## 2023-03-29 MED ORDER — SODIUM CHLORIDE 0.9 % IV SOLN: 500.0000 mL | INTRAVENOUS | Status: DC

## 2023-03-29 NOTE — Op Note (Signed)
[facility] Patient Name: Renee Sanchez Procedure Date: 03/29/2023 1:21 PM MRN: [id number] Endoscopist: Lorin Picket E. Tomasa [name] , MD, [id number] Age: 32 Referring MD:  Date of Birth: Apr 04, 1991 Gender: Female Account #: [id number] Procedure:                Upper GI endoscopy Indications:              Iron deficiency anemia secondary to chronic blood                            loss, Dysphagia, Suspected esophageal reflux Medicines:                Monitored Anesthesia Care Procedure:                Pre-Anesthesia Assessment:                           - Prior to the procedure, a History and Physical                            was performed, and patient medications and                            allergies were reviewed. The patient's tolerance of                            previous anesthesia was also reviewed. The risks                            and benefits of the procedure and the sedation                            options and risks were discussed with the patient.                            All questions were answered, and informed consent                            was obtained. Prior Anticoagulants: The patient has                            taken no anticoagulant or antiplatelet agents. ASA                            Grade Assessment: II - A patient with mild systemic                            disease. After reviewing the risks and benefits,                            the patient was deemed in satisfactory condition to                            undergo the procedure.  After obtaining informed consent, the endoscope was                            passed under direct vision. Throughout the                            procedure, the patient's blood pressure, pulse, and                            oxygen saturations were monitored continuously. The                            GIF [id number] #[id number] was introduced through the                            mouth,  and advanced to the third part of duodenum.                            The upper GI endoscopy was accomplished without                            difficulty. The patient tolerated the procedure                            well. Scope In: Scope Out: Findings:                 LA Grade D (one or more mucosal breaks involving at                            least 75% of esophageal circumference) esophagitis                            with no bleeding was found in the lower third of                            the esophagus.                           One benign-appearing, intrinsic moderate stenosis                            was found at the gastroesophageal junction. This                            stenosis measured 1.1 cm (inner diameter) x 1 cm                            (in length). The stenosis was traversed.                           A 3 cm hiatal hernia was present.                           The entire examined stomach  was normal. Biopsies                            were taken with a cold forceps for Helicobacter                            pylori testing. Estimated blood loss was minimal.                           The examined duodenum was normal. Biopsies for                            histology were taken with a cold forceps for                            evaluation of celiac disease. Estimated blood loss                            was minimal. Complications:            No immediate complications. Estimated Blood Loss:     Estimated blood loss was minimal. Impression:               - LA Grade D reflux esophagitis with no bleeding.                           - Benign-appearing esophageal stenosis. Not dilated                            to inflammation/ulceration.                           - 3 cm hiatal hernia.                           - Normal stomach. Biopsied.                           - Normal examined duodenum. Biopsied. Recommendation:           - Patient has a contact number  available for                            emergencies. The signs and symptoms of potential                            delayed complications were discussed with the                            patient. Return to normal activities tomorrow.                            Written discharge instructions were provided to the                            patient.                           -  Resume previous diet.                           - Continue present medications.                           - Await pathology results.                           - Repeat upper endoscopy in 8 weeks to check                            healing and potential dilation of stricture.                           - Use Protonix (pantoprazole) 40 mg PO BID for 8                            weeks, followed by once daily dosing. Unita Detamore E. Tomasa [name], MD 03/29/2023 1:40:29 PM This report has been signed electronically.

## 2023-03-29 NOTE — Patient Instructions (Addendum)
Thank you for letting us take care of your healthcare needs today. PLease see handouts given to you on Hiatal Hernia, Esophagitis and Stricture. Repeat Upper Endoscopy as scheduled. Start Protonix 40 mg twice a day for 8 weeks.   YOU HAD AN ENDOSCOPIC PROCEDURE TODAY AT THE [facility]:   Refer to the procedure report that was given to you for any specific questions about what was found during the examination.  If the procedure report does not answer your questions, please call your gastroenterologist to clarify.  If you requested that your care partner not be given the details of your procedure findings, then the procedure report has been included in a sealed envelope for you to review at your convenience later.  YOU SHOULD EXPECT: Some feelings of bloating in the abdomen. Passage of more gas than usual.  Walking can help get rid of the air that was put into your GI tract during the procedure and reduce the bloating. If you had a lower endoscopy (such as a colonoscopy or flexible sigmoidoscopy) you may notice spotting of blood in your stool or on the toilet paper. If you underwent a bowel prep for your procedure, you may not have a normal bowel movement for a few days.  Please Note:  You might notice some irritation and congestion in your nose or some drainage.  This is from the oxygen used during your procedure.  There is no need for concern and it should clear up in a day or so.  SYMPTOMS TO REPORT IMMEDIATELY:   Following upper endoscopy (EGD)  Vomiting of blood or coffee ground material  New chest pain or pain under the shoulder blades  Painful or persistently difficult swallowing  New shortness of breath  Fever of 100F or higher  Black, tarry-looking stools  For urgent or emergent issues, a gastroenterologist can be reached at any hour by calling [phone]. Do not use MyChart messaging for urgent concerns.    DIET:  We do recommend a small meal at first, but then  you may proceed to your regular diet.  Drink plenty of fluids but you should avoid alcoholic beverages for 24 hours.  ACTIVITY:  You should plan to take it easy for the rest of today and you should NOT DRIVE or use heavy machinery until tomorrow (because of the sedation medicines used during the test).    FOLLOW UP: Our staff will call the number listed on your records the next business day following your procedure.  We will call around 7:15- 8:00 am to check on you and address any questions or concerns that you may have regarding the information given to you following your procedure. If we do not reach you, we will leave a message.     If any biopsies were taken you will be contacted by phone or by letter within the next 1-3 weeks.  Please call us at [phone] if you have not heard about the biopsies in 3 weeks.    SIGNATURES/CONFIDENTIALITY: You and/or your care partner have signed paperwork which will be entered into your electronic medical record.  These signatures attest to the fact that that the information above on your After Visit Summary has been reviewed and is understood.  Full responsibility of the confidentiality of this discharge information lies with you and/or your care-partner.

## 2023-03-29 NOTE — Progress Notes (Signed)
Called to room to assist during endoscopic procedure.  Patient ID and intended procedure confirmed with present staff. Received instructions for my participation in the procedure from the performing physician.  

## 2023-03-29 NOTE — Progress Notes (Signed)
Sedate, gd SR, tolerated procedure well, VSS, report to RN 

## 2023-03-29 NOTE — Progress Notes (Signed)
History and Physical Interval Note:  03/29/2023 1:18 PM  Renee Sanchez  has presented today for endoscopic procedure(s), with the diagnosis of  Encounter Diagnosis  Name Primary?   Gastroesophageal reflux disease, unspecified whether esophagitis present Yes  .  The various methods of evaluation and treatment have been discussed with the patient and/or family. After consideration of risks, benefits and other options for treatment, the patient has consented to  the endoscopic procedure(s).   The patient's history has been reviewed, patient examined, no change in status, stable for endoscopic procedure(s).  I have reviewed the patient's chart and labs.  Questions were answered to the patient's satisfaction.     Yuritzy Zehring E. Tomasa [name], MD [facility]

## 2023-04-01 ENCOUNTER — Telehealth: Payer: Self-pay

## 2023-04-01 NOTE — Telephone Encounter (Signed)
Follow up call to pt, no answer. 

## 2023-04-03 LAB — SURGICAL PATHOLOGY

## 2023-04-05 ENCOUNTER — Telehealth: Payer: 59 | Admitting: Physician Assistant

## 2023-04-05 DIAGNOSIS — R0989 Other specified symptoms and signs involving the circulatory and respiratory systems: Secondary | ICD-10-CM | POA: Diagnosis not present

## 2023-04-05 NOTE — Progress Notes (Signed)
E Visit for Rash  We are sorry that you are not feeling well. Here is how we plan to help!  It appears that the iron infusion or IV itself has caused a traumatic bruising of the vein, possibly even a small superficial thrombophlebitis. This is a inflammation and a small superficial blood clot in the vein. These are not life threatening but are painful.   HOME CARE:  Apply warm compress to the area throughout the day May take NSAIDs (Ibuprofen/advil/motrin, aleve/naproxen May take Tylenol  GET HELP RIGHT AWAY IF:  Symptoms don't go away after treatment. Severe itching that persists. If you rash spreads or swells. If you rash begins to smell. If it blisters and opens or develops a yellow-brown crust. You develop a fever. You have a sore throat. You become short of breath.  MAKE SURE YOU:  Understand these instructions. Will watch your condition. Will get help right away if you are not doing well or get worse.  Thank you for choosing an e-visit.  Your e-visit answers were reviewed by a board certified advanced clinical practitioner to complete your personal care plan. Depending upon the condition, your plan could have included both over the counter or prescription medications.  Please review your pharmacy choice. Make sure the pharmacy is open so you can pick up prescription now. If there is a problem, you may contact your provider through [employer] and have the prescription routed to another pharmacy.  Your safety is important to [place]. If you have drug allergies check your prescription carefully.   For the next 24 hours you can use MyChart to ask questions about today's visit, request a non-urgent call back, or ask for a work or school excuse. You will get an email in the next two days asking about your experience. I hope that your e-visit has been valuable and will speed your recovery.  I have spent 5 minutes in review of e-visit questionnaire, review and updating patient  chart, medical decision making and response to patient.   Margaretann Loveless, PA-C

## 2023-04-07 NOTE — Progress Notes (Signed)
Renee Sanchez,  The biopsies of your duodenum were normal. There was no evidence of celiac disease. The biopsies taken from your stomach were notable for mild chronic gastritis (inflammation) which is a common finding, but there was no evidence of Helicobacter pylori infection. This common finding is not likely to explain iron deficiency anemia and there is no specific treatment or further evaluation recommended. Please continue to take the Protonix twice daily until your repeat upper endoscopy to assess healing of your esophagitis.

## 2023-04-16 ENCOUNTER — Encounter: Payer: Self-pay | Admitting: Hematology

## 2023-04-16 ENCOUNTER — Ambulatory Visit (HOSPITAL_COMMUNITY)
Admission: EM | Admit: 2023-04-16 | Discharge: 2023-04-16 | Disposition: A | Discharge status: Home / Self Care | Payer: 59 | Attending: Family Medicine | Admitting: Family Medicine

## 2023-04-16 ENCOUNTER — Encounter (HOSPITAL_COMMUNITY): Payer: Self-pay

## 2023-04-16 ENCOUNTER — Other Ambulatory Visit (HOSPITAL_COMMUNITY): Payer: Self-pay

## 2023-04-16 DIAGNOSIS — I808 Phlebitis and thrombophlebitis of other sites: Secondary | ICD-10-CM

## 2023-04-16 MED ORDER — PREDNISONE 20 MG PO TABS
40.0000 mg | ORAL_TABLET | Freq: Every day | ORAL | 0 refills | Status: AC
  Filled 2023-04-16: qty 10, 5d supply, fill #0

## 2023-04-16 NOTE — ED Provider Notes (Signed)
[facility]    CSN: [id number] Arrival date & time: 04/16/23  0841      History   Chief Complaint Chief Complaint  Patient presents with   Hand Pain    HPI Renee Sanchez is a 32 y.o. female.    Hand Pain  Here for pain and some swelling in her left hand.  On August 30 she had an iron infusion done.  The IV site was along the distal dorsum of her left hand.  A couple of days later she started feeling a little bit of swelling and discomfort proximal to that still over her left wrist.  She has done warm compresses a couple of times and did have an ED visit in which warm compresses and NSAIDs were recommended.  If not really improved and her left arm feels cold now.  No fever or chills and no numbness or tingling.  She did just start Protonix.  She will be at the hematology office for iron infusion was done on September 27.  Past Medical History:  Diagnosis Date   ADD (attention deficit disorder)    Amenorrhea    Anemia    Anxiety    Chronic fatigue    Depression    Family history of breast cancer    Family history of colon cancer    Family history of ovarian cancer    Family history of pancreatic cancer    GERD (gastroesophageal reflux disease)    History of kidney stones    Insomnia    Kidney stone 09/02/2021   Late menses    Low iron    Missed period    Nephrolithiasis    Normal intrauterine pregnancy in third trimester 08/25/2014   Palpitations    Panic disorder    Retained products of conception, early pregnancy 01/17/2017    Patient Active Problem List   Diagnosis Date Noted   Iron deficiency anemia due to chronic blood loss 08/31/2022   Palpitations 09/14/2021   Shortness of breath 09/14/2021   Low ferritin 09/14/2021   Near syncope 09/14/2021   Genetic testing 05/19/2020   Family history of breast cancer    Family history of pancreatic cancer    Family history of ovarian cancer    Family history of colon cancer    NSVD (normal  spontaneous vaginal delivery) 04/19/2019   Indication for care in labor and delivery, antepartum 04/18/2019   Retained products of conception, early pregnancy 01/17/2017   Normal labor 08/25/2014   Normal intrauterine pregnancy in third trimester 08/25/2014   SVD (spontaneous vaginal delivery) 08/25/2014   Leukocytosis, unspecified 02/22/2014   Subchorionic hemorrhage in first trimester 02/22/2014   Hyponatremia 02/22/2014   Hypokalemia 02/22/2014   Normocytic anemia 02/22/2014   Nephrolithiasis 02/21/2014   Left sided abdominal pain 02/21/2014   Currently pregnant 02/21/2014   Nausea and vomiting 02/21/2014    Past Surgical History:  Procedure Laterality Date   DILATION AND CURETTAGE OF UTERUS  01/14/2017   kidney stent     kidney stent removal and removal of kidney stone     UPPER GASTROINTESTINAL ENDOSCOPY  03/29/2023   Tiajuana Amass at [facility]    OB History     Gravida  4   Para  2   Term  1   Preterm  1   AB  2   Living  2      SAB  2   IAB      Ectopic  Multiple  0   Live Births  2            Home Medications    Prior to Admission medications   Medication Sig Start Date End Date Taking? Authorizing Provider  predniSONE (DELTASONE) 20 MG tablet Take 2 tablets (40 mg total) by mouth daily with breakfast for 5 days. 04/16/23 04/21/23 Yes Zenia Resides, MD  acetaminophen (TYLENOL) 500 MG tablet Take 500 mg by mouth every 6 (six) hours as needed for mild pain.    [provider]  Cholecalciferol (VITAMIN D-1000 MAX ST) 25 MCG (1000 UT) tablet Take by mouth.    [provider]  Multiple Vitamin (MULTI-VITAMIN) tablet Take 1 tablet by mouth daily.    [provider]  pantoprazole (PROTONIX) 40 MG tablet Take 1 tablet (40 mg total) by mouth 2 (two) times daily. 03/29/23   Jenel Lucks, MD  SUMAtriptan (IMITREX) 25 MG tablet Take 1 tablet (25 mg total) by mouth 2 (two) times daily as needed, at least 2 hours between  doses 04/13/22       Family History Family History  Problem Relation Age of Onset   Varicose Veins Mother    ADD / ADHD Father    Alcohol abuse Father    Drug abuse Father    Anxiety disorder Father    Depression Father    ADD / ADHD Brother    Uterine cancer Paternal Aunt    Cervical cancer Paternal Aunt        dx 57s, genetic test + for colon gene   Pancreatic cancer Paternal Uncle 20   Breast cancer Maternal Grandmother        late 60s-70s   Arthritis Paternal Grandmother    COPD Paternal Grandmother    Diabetes Paternal Grandmother    Breast cancer Paternal Grandmother 38   Cancer Paternal Grandmother        fallopian tube dx 60   Cancer - Other Paternal Grandmother        tongue cancer dx 66   Colon polyps Neg Hx    Colon cancer Neg Hx    Esophageal cancer Neg Hx    Rectal cancer Neg Hx    Stomach cancer Neg Hx     Social History Social History   Tobacco Use   Smoking status: Former   Smokeless tobacco: Never  Substance Use Topics   Alcohol use: Not Currently    Comment: social   Drug use: No     Allergies   Bupropion   Review of Systems Review of Systems   Physical Exam Triage Vital Signs ED Triage Vitals [04/16/23 0913]  Encounter Vitals Group     BP 112/73     Systolic BP Percentile      Diastolic BP Percentile      Pulse Rate 65     Resp 16     Temp 98 F (36.7 C)     Temp Source Oral     SpO2 97 %     Weight 175 lb (79.4 kg)     Height 5\' 3"  (1.6 m)     Head Circumference      Peak Flow      Pain Score 4     Pain Loc      Pain Education      Exclude from Growth Chart    No data found.  Updated Vital Signs BP 112/73 (BP Location: Right Arm)   Pulse 65  Temp 98 F (36.7 C) (Oral)   Resp 16   Ht 5\' 3"  (1.6 m)   Wt 79.4 kg   LMP 04/03/2023 (Exact Date) Comment: pt signed pregnancy waiver 03/29/23  SpO2 97%   Breastfeeding No   BMI 31.00 kg/m   Visual Acuity Right Eye Distance:   Left Eye Distance:   Bilateral  Distance:    Right Eye Near:   Left Eye Near:    Bilateral Near:     Physical Exam Vitals reviewed.  Constitutional:      General: She is not in acute distress.    Appearance: She is not toxic-appearing.  Cardiovascular:     Pulses: Normal pulses.  Musculoskeletal:     Comments: The left arm is normal in color and capillary refill is normal.  There is an area that is about 2 cm in diameter that is slightly swollen and firm.  It is a little tender there.  There is no fluctuance.  Pulses at the radial area and the ulnar area are normal and 2+.  Skin:    Coloration: Skin is not jaundiced or pale.  Neurological:     Mental Status: She is alert and oriented to person, place, and time.  Psychiatric:        Behavior: Behavior normal.      UC Treatments / Results  Labs (all labs ordered are listed, but only abnormal results are displayed) Labs Reviewed - No data to display  EKG   Radiology No results found.  Procedures Procedures (including critical care time)  Medications Ordered in UC Medications - No data to display  Initial Impression / Assessment and Plan / UC Course  I have reviewed the triage vital signs and the nursing notes.  Pertinent labs & imaging results that were available during my care of the patient were reviewed by me and considered in my medical decision making (see chart for details).         Final Clinical Impressions(s) / UC Diagnoses   Final diagnoses:  Superficial thrombophlebitis of left upper extremity     Discharge Instructions      Take prednisone 20 mg--2 daily for 5 days  Continue the Protonix.  If your hand or arm becomes pale or numb or has painful tingling, then please proceed to the emergency room for urgent evaluation.  I am glad you are going to see your hematology office on the 27th.     ED Prescriptions     Medication Sig Dispense Auth. Provider   predniSONE (DELTASONE) 20 MG tablet Take 2 tablets (40 mg  total) by mouth daily with breakfast for 5 days. 10 tablet Marlinda Mike Janace Aris, MD      PDMP not reviewed this encounter.   Zenia Resides, MD 04/16/23 [phone]

## 2023-04-16 NOTE — ED Triage Notes (Signed)
Patient here today with c/o left hand pain since as few days after having an IV in left hand on 03/22/2023. She states that it is swollen and painful. Her arm feels funny "cold". There appears to be some swelling and a bruise.

## 2023-04-16 NOTE — Discharge Instructions (Signed)
Take prednisone 20 mg--2 daily for 5 days  Continue the Protonix.  If your hand or arm becomes pale or numb or has painful tingling, then please proceed to the emergency room for urgent evaluation.  I am glad you are going to see your hematology office on the 27th.

## 2023-04-19 ENCOUNTER — Inpatient Hospital Stay: Payer: 59

## 2023-04-22 ENCOUNTER — Encounter: Payer: Self-pay | Admitting: Hematology

## 2023-04-22 ENCOUNTER — Other Ambulatory Visit (HOSPITAL_COMMUNITY): Payer: Self-pay

## 2023-04-23 ENCOUNTER — Inpatient Hospital Stay: Payer: 59 | Attending: Hematology

## 2023-04-23 DIAGNOSIS — D500 Iron deficiency anemia secondary to blood loss (chronic): Secondary | ICD-10-CM | POA: Insufficient documentation

## 2023-04-23 LAB — CBC WITH DIFFERENTIAL/PLATELET
Abs Immature Granulocytes: 0.02 10*3/uL (ref 0.00–0.07)
Basophils Absolute: 0 10*3/uL (ref 0.0–0.1)
Basophils Relative: 1 %
Eosinophils Absolute: 0 10*3/uL (ref 0.0–0.5)
Eosinophils Relative: 0 %
HCT: 40.3 % (ref 36.0–46.0)
Hemoglobin: 13.6 g/dL (ref 12.0–15.0)
Immature Granulocytes: 0 %
Lymphocytes Relative: 33 %
Lymphs Abs: 2.9 10*3/uL (ref 0.7–4.0)
MCH: 30.9 pg (ref 26.0–34.0)
MCHC: 33.7 g/dL (ref 30.0–36.0)
MCV: 91.6 fL (ref 80.0–100.0)
Monocytes Absolute: 0.6 10*3/uL (ref 0.1–1.0)
Monocytes Relative: 7 %
Neutro Abs: 5.2 10*3/uL (ref 1.7–7.7)
Neutrophils Relative %: 59 %
Platelets: 210 10*3/uL (ref 150–400)
RBC: 4.4 MIL/uL (ref 3.87–5.11)
RDW: 12.3 % (ref 11.5–15.5)
WBC: 8.7 10*3/uL (ref 4.0–10.5)
nRBC: 0 % (ref 0.0–0.2)

## 2023-04-23 LAB — FERRITIN: Ferritin: 181 ng/mL (ref 11–307)

## 2023-05-15 DIAGNOSIS — Z09 Encounter for follow-up examination after completed treatment for conditions other than malignant neoplasm: Secondary | ICD-10-CM | POA: Diagnosis not present

## 2023-05-15 DIAGNOSIS — D2261 Melanocytic nevi of right upper limb, including shoulder: Secondary | ICD-10-CM | POA: Diagnosis not present

## 2023-05-15 DIAGNOSIS — D2262 Melanocytic nevi of left upper limb, including shoulder: Secondary | ICD-10-CM | POA: Diagnosis not present

## 2023-05-15 DIAGNOSIS — D485 Neoplasm of uncertain behavior of skin: Secondary | ICD-10-CM | POA: Diagnosis not present

## 2023-05-15 DIAGNOSIS — D225 Melanocytic nevi of trunk: Secondary | ICD-10-CM | POA: Diagnosis not present

## 2023-05-15 DIAGNOSIS — D2272 Melanocytic nevi of left lower limb, including hip: Secondary | ICD-10-CM | POA: Diagnosis not present

## 2023-05-15 DIAGNOSIS — D2271 Melanocytic nevi of right lower limb, including hip: Secondary | ICD-10-CM | POA: Diagnosis not present

## 2023-05-15 DIAGNOSIS — Z872 Personal history of diseases of the skin and subcutaneous tissue: Secondary | ICD-10-CM | POA: Diagnosis not present

## 2023-05-24 ENCOUNTER — Encounter: Payer: 59 | Admitting: Gastroenterology

## 2023-05-28 ENCOUNTER — Telehealth: Payer: Self-pay | Admitting: Hematology

## 2023-05-30 ENCOUNTER — Inpatient Hospital Stay: Payer: 59 | Admitting: Hematology

## 2023-05-30 ENCOUNTER — Inpatient Hospital Stay: Payer: 59

## 2023-05-31 ENCOUNTER — Encounter: Payer: Self-pay | Admitting: Gastroenterology

## 2023-05-31 ENCOUNTER — Ambulatory Visit (AMBULATORY_SURGERY_CENTER): Payer: 59 | Admitting: Gastroenterology

## 2023-05-31 VITALS — BP 102/57 | HR 70 | Temp 98.8°F | Resp 14 | Ht 63.0 in | Wt 173.0 lb

## 2023-05-31 DIAGNOSIS — R1310 Dysphagia, unspecified: Secondary | ICD-10-CM | POA: Diagnosis not present

## 2023-05-31 DIAGNOSIS — K222 Esophageal obstruction: Secondary | ICD-10-CM | POA: Diagnosis not present

## 2023-05-31 DIAGNOSIS — F32A Depression, unspecified: Secondary | ICD-10-CM | POA: Diagnosis not present

## 2023-05-31 DIAGNOSIS — R1319 Other dysphagia: Secondary | ICD-10-CM

## 2023-05-31 DIAGNOSIS — F419 Anxiety disorder, unspecified: Secondary | ICD-10-CM | POA: Diagnosis not present

## 2023-05-31 MED ORDER — SODIUM CHLORIDE 0.9 % IV SOLN: 500.0000 mL | Freq: Once | INTRAVENOUS | Status: AC

## 2023-05-31 NOTE — Patient Instructions (Addendum)
Soft diet today. Advance tomorrow if not experiencing any discomfort with swallowing.                           - Continue present medications.                            Continue Protonix once daily.    YOU HAD AN ENDOSCOPIC PROCEDURE TODAY AT THE [facility]:   Refer to the procedure report that was given to you for any specific questions about what was found during the examination.  If the procedure report does not answer your questions, please call your gastroenterologist to clarify.  If you requested that your care partner not be given the details of your procedure findings, then the procedure report has been included in a sealed envelope for you to review at your convenience later.  YOU SHOULD EXPECT: Some feelings of bloating in the abdomen. Passage of more gas than usual.  Walking can help get rid of the air that was put into your GI tract during the procedure and reduce the bloating. If you had a lower endoscopy (such as a colonoscopy or flexible sigmoidoscopy) you may notice spotting of blood in your stool or on the toilet paper. If you underwent a bowel prep for your procedure, you may not have a normal bowel movement for a few days.  Please Note:  You might notice some irritation and congestion in your nose or some drainage.  This is from the oxygen used during your procedure.  There is no need for concern and it should clear up in a day or so.  SYMPTOMS TO REPORT IMMEDIATELY:  Following upper endoscopy (EGD)  Vomiting of blood or coffee ground material  New chest pain or pain under the shoulder blades  Painful or persistently difficult swallowing  New shortness of breath  Fever of 100F or higher  Black, tarry-looking stools  For urgent or emergent issues, a gastroenterologist can be reached at any hour by calling [phone]. Do not use MyChart messaging for urgent concerns.    DIET:  Soft diet today, tomorrow you may proceed to your regular diet.  Drink plenty  of fluids but you should avoid alcoholic beverages for 24 hours.  ACTIVITY:  You should plan to take it easy for the rest of today and you should NOT DRIVE or use heavy machinery until tomorrow (because of the sedation medicines used during the test).    FOLLOW UP: Our staff will call the number listed on your records the next business day following your procedure.  We will call around 7:15- 8:00 am to check on you and address any questions or concerns that you may have regarding the information given to you following your procedure. If we do not reach you, we will leave a message.     If any biopsies were taken you will be contacted by phone or by letter within the next 1-3 weeks.  Please call us at [phone] if you have not heard about the biopsies in 3 weeks.    SIGNATURES/CONFIDENTIALITY: You and/or your care partner have signed paperwork which will be entered into your electronic medical record.  These signatures attest to the fact that that the information above on your After Visit Summary has been reviewed and is understood.  Full responsibility of the confidentiality of this discharge information lies with you and/or your care-partner.

## 2023-05-31 NOTE — Op Note (Signed)
[facility] Patient Name: Renee Sanchez Procedure Date: 05/31/2023 9:42 AM MRN: [id number] Endoscopist: Lorin Picket E. Tomasa [name] , MD, [id number] Age: 32 Referring MD:  Date of Birth: 05-20-1991 Gender: Female Account #: [id number] Procedure:                Upper GI endoscopy Indications:              Follow-up of reflux esophagitis, For therapy of                            esophageal stenosis Medicines:                Monitored Anesthesia Care Procedure:                Pre-Anesthesia Assessment:                           - Prior to the procedure, a History and Physical                            was performed, and patient medications and                            allergies were reviewed. The patient's tolerance of                            previous anesthesia was also reviewed. The risks                            and benefits of the procedure and the sedation                            options and risks were discussed with the patient.                            All questions were answered, and informed consent                            was obtained. Prior Anticoagulants: The patient has                            taken no anticoagulant or antiplatelet agents. ASA                            Grade Assessment: II - A patient with mild systemic                            disease. After reviewing the risks and benefits,                            the patient was deemed in satisfactory condition to                            undergo the procedure.  After obtaining informed consent, the endoscope was                            passed under direct vision. Throughout the                            procedure, the patient's blood pressure, pulse, and                            oxygen saturations were monitored continuously. The                            GIF [id number] #[id number] was introduced through the                            mouth, and advanced to the second  part of duodenum.                            The upper GI endoscopy was accomplished without                            difficulty. The patient tolerated the procedure                            well. Scope In: Scope Out: Findings:                 The examined portions of the nasopharynx,                            oropharynx and larynx were normal.                           One benign-appearing, intrinsic mild stenosis was                            found at the gastroesophageal junction. This                            stenosis measured 1.2 cm (inner diameter) x less                            than one cm (in length). The stenosis was                            traversed. A TTS dilator was passed through the                            scope. Dilation with a 15-16.5-18 mm balloon                            dilator was performed to 18 mm. The dilation site                            was  examined and showed moderate mucosal                            disruption. Estimated blood loss was minimal.                           The exam of the esophagus was otherwise normal.                           A 5 cm hiatal hernia was present.                           The exam of the stomach was otherwise normal.                           The examined duodenum was normal. Complications:            No immediate complications. Estimated Blood Loss:     Estimated blood loss was minimal. Impression:               - The examined portions of the nasopharynx,                            oropharynx and larynx were normal.                           - Benign-appearing esophageal stenosis. Dilated.                           - The previously noted esophagitis has healed                           - 5 cm hiatal hernia.                           - Normal examined duodenum.                           - No specimens collected. Recommendation:           - Patient has a contact number available for                             emergencies. The signs and symptoms of potential                            delayed complications were discussed with the                            patient. Return to normal activities tomorrow.                            Written discharge instructions were provided to the                            patient.                           -  Soft diet today. Advance tomorrow if not                            experiencing any discomfort with swallowing.                           - Continue present medications. Continue Protonix                            once daily. Corin Tilly E. Tomasa [name], MD 05/31/2023 10:07:26 AM This report has been signed electronically.

## 2023-05-31 NOTE — Progress Notes (Signed)
Report to PACU, RN, vss, BBS= Clear.  

## 2023-05-31 NOTE — Progress Notes (Signed)
Vitals-CW  Pt's states no medical or surgical changes since previsit or office visit. 

## 2023-05-31 NOTE — Progress Notes (Signed)
Gastroenterology History and Physical   Primary Care Physician:  [facility]   Reason for Procedure:   Follow up reflux esophagitis, dysphagia  Plan:    EGD with possible dilation     HPI: Renee Sanchez is a 32 y.o. female undergoing repeat EGD.  She had an EGD in September which showed LA Grade D esophagitis with an esophageal stricture which was not dilated due to presence of severe inflammation.  She was treated with twice daily Protonix.  Repeat EGD to assess healing of esophagitis and possible dilation.   Her GERD have improved significantly with BID PPI.  Her dysphagia has improved, but she occasional dysphagia to food and regular large pill dysphagia.   Past Medical History:  Diagnosis Date   ADD (attention deficit disorder)    Amenorrhea    Anemia    Anxiety    Chronic fatigue    Depression    Family history of breast cancer    Family history of colon cancer    Family history of ovarian cancer    Family history of pancreatic cancer    GERD (gastroesophageal reflux disease)    History of kidney stones    Insomnia    Kidney stone 09/02/2021   Late menses    Low iron    Missed period    Nephrolithiasis    Normal intrauterine pregnancy in third trimester 08/25/2014   Palpitations    Panic disorder    Retained products of conception, early pregnancy 01/17/2017    Past Surgical History:  Procedure Laterality Date   DILATION AND CURETTAGE OF UTERUS  01/14/2017   kidney stent     kidney stent removal and removal of kidney stone     UPPER GASTROINTESTINAL ENDOSCOPY  03/29/2023   Tiajuana Amass at [facility]    Prior to Admission medications   Medication Sig Start Date End Date Taking? Authorizing Provider  Cholecalciferol (VITAMIN D-1000 MAX ST) 25 MCG (1000 UT) tablet Take by mouth.   Yes [provider]  Multiple Vitamin (MULTI-VITAMIN) tablet Take 1 tablet by mouth daily.   Yes [provider]  pantoprazole  (PROTONIX) 40 MG tablet Take 1 tablet (40 mg total) by mouth 2 (two) times daily. 03/29/23  Yes Jenel Lucks, MD  acetaminophen (TYLENOL) 500 MG tablet Take 500 mg by mouth every 6 (six) hours as needed for mild pain.    [provider]  SUMAtriptan (IMITREX) 25 MG tablet Take 1 tablet (25 mg total) by mouth 2 (two) times daily as needed, at least 2 hours between doses 04/13/22       Current Outpatient Medications  Medication Sig Dispense Refill   Cholecalciferol (VITAMIN D-1000 MAX ST) 25 MCG (1000 UT) tablet Take by mouth.     Multiple Vitamin (MULTI-VITAMIN) tablet Take 1 tablet by mouth daily.     pantoprazole (PROTONIX) 40 MG tablet Take 1 tablet (40 mg total) by mouth 2 (two) times daily. 90 tablet 3   acetaminophen (TYLENOL) 500 MG tablet Take 500 mg by mouth every 6 (six) hours as needed for mild pain.     SUMAtriptan (IMITREX) 25 MG tablet Take 1 tablet (25 mg total) by mouth 2 (two) times daily as needed, at least 2 hours between doses 20 tablet 5   Current Facility-Administered Medications  Medication Dose Route Frequency Provider Last Rate Last Admin   0.9 %  sodium chloride infusion  500 mL Intravenous Once Jenel Lucks, MD  Allergies as of 05/31/2023 - Review Complete 05/31/2023  Allergen Reaction Noted   Bupropion  09/13/2021    Family History  Problem Relation Age of Onset   Varicose Veins Mother    ADD / ADHD Father    Alcohol abuse Father    Drug abuse Father    Anxiety disorder Father    Depression Father    ADD / ADHD Brother    Uterine cancer Paternal Aunt    Cervical cancer Paternal Aunt        dx 53s, genetic test + for colon gene   Pancreatic cancer Paternal Uncle 16   Breast cancer Maternal Grandmother        late 60s-70s   Arthritis Paternal Grandmother    COPD Paternal Grandmother    Diabetes Paternal Grandmother    Breast cancer Paternal Grandmother 59   Cancer Paternal Grandmother        fallopian tube dx 25   Cancer -  Other Paternal Grandmother        tongue cancer dx 49   Colon polyps Neg Hx    Colon cancer Neg Hx    Esophageal cancer Neg Hx    Rectal cancer Neg Hx    Stomach cancer Neg Hx     Social History   Socioeconomic History   Marital status: Married    Spouse name: Ladona Ridgel   Number of children: 2   Years of education: Not on file   Highest education level: Not on file  Occupational History   Not on file  Tobacco Use   Smoking status: Former   Smokeless tobacco: Never  Substance and Sexual Activity   Alcohol use: Not Currently    Comment: social   Drug use: No   Sexual activity: Yes    Birth control/protection: None  Other Topics Concern   Not on file  Social History Narrative   Not on file   Social Determinants of Health   Financial Resource Strain: Low Risk  (11/08/2022)   Received from [facility], [facility]   Overall Financial Resource Strain (CARDIA)    Difficulty of Paying Living Expenses: Not very hard  Food Insecurity: No Food Insecurity (11/08/2022)   Received from [facility], [facility]   Hunger Vital Sign    Worried About Running Out of Food in the Last Year: Never true    Ran Out of Food in the Last Year: Never true  Transportation Needs: No Transportation Needs (11/08/2022)   Received from [facility], [facility]   PRAPARE - Transportation    In the past 12 months, has lack of transportation kept you from medical appointments or from getting medications?: No    Lack of Transportation (Non-Medical): No  Physical Activity: Unknown (04/18/2019)   Exercise Vital Sign    Days of Exercise per Week: Patient declined    Minutes of Exercise per Session: Patient declined  Stress: Stress Concern Present (04/18/2019)   [employer] of Occupational Health - Occupational Stress Questionnaire    Feeling of Stress : To some extent  Social Connections: Unknown  (04/18/2019)   Social Connection and Isolation Panel [NHANES]    Frequency of Communication with Friends and Family: Patient declined    Frequency of Social Gatherings with Friends and Family: Patient declined    Attends Religious Services: Patient declined    Database administrator or Organizations: Patient declined    Attends [employer]  or Organization Meetings: Patient declined    Marital Status: Patient declined  Intimate Partner Violence: Not At Risk (04/18/2019)   Humiliation, Afraid, Rape, and Kick questionnaire    Fear of Current or Ex-Partner: No    Emotionally Abused: No    Physically Abused: No    Sexually Abused: No    Review of Systems:  All other review of systems negative except as mentioned in the HPI.  Physical Exam: Vital signs BP 118/75 (BP Location: Right Arm, Patient Position: Sitting, Cuff Size: Normal)   Pulse 73   Temp 98.8 F (37.1 C) (Temporal)   Ht 5\' 3"  (1.6 m)   Wt 173 lb (78.5 kg)   SpO2 98%   BMI 30.65 kg/m   General:   Alert,  Well-developed, well-nourished, pleasant and cooperative in NAD Airway:  Mallampati 2 Lungs:  Clear throughout to auscultation.   Heart:  Regular rate and rhythm; no murmurs, clicks, rubs,  or gallops. Abdomen:  Soft, nontender and nondistended. Normal bowel sounds.   Neuro/Psych:  Normal mood and affect. A and O x 3   Reagyn Facemire E. Tomasa [name], MD [facility]

## 2023-05-31 NOTE — Progress Notes (Signed)
Called to room to assist during endoscopic procedure.  Patient ID and intended procedure confirmed with present staff. Received instructions for my participation in the procedure from the performing physician.  

## 2023-06-03 ENCOUNTER — Telehealth: Payer: Self-pay

## 2023-06-03 NOTE — Telephone Encounter (Signed)
Left message on follow up call. 

## 2023-06-06 ENCOUNTER — Inpatient Hospital Stay: Payer: 59 | Admitting: Hematology

## 2023-06-06 ENCOUNTER — Inpatient Hospital Stay: Payer: 59 | Attending: Hematology

## 2023-06-06 VITALS — BP 113/52 | HR 65 | Temp 97.9°F | Ht 63.0 in | Wt 182.1 lb

## 2023-06-06 DIAGNOSIS — D500 Iron deficiency anemia secondary to blood loss (chronic): Secondary | ICD-10-CM | POA: Diagnosis not present

## 2023-06-06 DIAGNOSIS — N920 Excessive and frequent menstruation with regular cycle: Secondary | ICD-10-CM | POA: Diagnosis not present

## 2023-06-06 DIAGNOSIS — G43909 Migraine, unspecified, not intractable, without status migrainosus: Secondary | ICD-10-CM | POA: Diagnosis not present

## 2023-06-06 DIAGNOSIS — Z79899 Other long term (current) drug therapy: Secondary | ICD-10-CM | POA: Insufficient documentation

## 2023-06-06 LAB — CBC WITH DIFFERENTIAL/PLATELET
Abs Immature Granulocytes: 0.02 10*3/uL (ref 0.00–0.07)
Basophils Absolute: 0.1 10*3/uL (ref 0.0–0.1)
Basophils Relative: 1 %
Eosinophils Absolute: 0.3 10*3/uL (ref 0.0–0.5)
Eosinophils Relative: 4 %
HCT: 40.9 % (ref 36.0–46.0)
Hemoglobin: 14.1 g/dL (ref 12.0–15.0)
Immature Granulocytes: 0 %
Lymphocytes Relative: 20 %
Lymphs Abs: 1.6 10*3/uL (ref 0.7–4.0)
MCH: 31.4 pg (ref 26.0–34.0)
MCHC: 34.5 g/dL (ref 30.0–36.0)
MCV: 91.1 fL (ref 80.0–100.0)
Monocytes Absolute: 0.5 10*3/uL (ref 0.1–1.0)
Monocytes Relative: 7 %
Neutro Abs: 5.5 10*3/uL (ref 1.7–7.7)
Neutrophils Relative %: 68 %
Platelets: 233 10*3/uL (ref 150–400)
RBC: 4.49 MIL/uL (ref 3.87–5.11)
RDW: 12 % (ref 11.5–15.5)
WBC: 8 10*3/uL (ref 4.0–10.5)
nRBC: 0 % (ref 0.0–0.2)

## 2023-06-06 LAB — FERRITIN: Ferritin: 171 ng/mL (ref 11–307)

## 2023-06-06 NOTE — Assessment & Plan Note (Addendum)
-  Per patient, she has had iron deficiency related symptoms for many years, including fatigue, palpitation, dyspnea on exertion, insomnia and anxiety.  -Her recent lab showed ferritin 10, increased TIBC, consistent with iron deficiency.  She has normal CBC, no anemia or microcytosis. -Her iron deficiency is secondary to her menorrhagia -She has tried oral iron for many years, could not tolerate due to GI symptoms. -She received IV iron Venofer for total of 1 g in February and March 2024. -Upper endoscopy in September 2024 showed esophagitis, she was treated with PPI and resolved on follow-up EGD. -Due to her symptomatic iron deficiency, will give IV iron if ferritin less than 150.

## 2023-06-06 NOTE — Progress Notes (Signed)
[facility]   [phone] [phone]   Clinic Follow up Note   Patient Care Team: Malachy Mood, MD as PCP - General (Hematology) Jake Bathe, MD as PCP - Cardiology (Cardiology)  Date of Service:  06/06/2023  CHIEF COMPLAINT: f/u of iron deficient anemia  CURRENT THERAPY:  IV Venofer 400 mg as needed if ferritin less than 150  Oncology History   Iron deficiency anemia due to chronic blood loss -Per patient, she has had iron deficiency related symptoms for many years, including fatigue, palpitation, dyspnea on exertion, insomnia and anxiety.  -Her recent lab showed ferritin 10, increased TIBC, consistent with iron deficiency.  She has normal CBC, no anemia or microcytosis. -Her iron deficiency is secondary to her menorrhagia -She has tried oral iron for many years, could not tolerate due to GI symptoms. -She received IV iron Venofer for total of 1 g in February and March 2024. -Repeated lab last week showed normal CBC, ferritin 100.    Assessment and Plan    Iron Deficiency Anemia Follow-up for iron deficiency anemia. Last IV iron infusion in August showed initial improvement. Currently taking beef organ pill since October. Experiences migraines with low ferritin. Current blood counts normal, not anemic. Heavy menstrual periods likely main source of deficiency. Discussed checking ferritin today and considering IV iron infusion if ferritin <150 or symptoms recur. Prefers to wait for ferritin results before deciding on next infusion. - Check ferritin level today - Follow up with iron studies every 2-3 months - Schedule next appointment in 8 months - Consider IV iron infusion if ferritin <150 or symptoms recur  IV Site Reaction Persistent hard knot at last IV iron infusion site. No pain but remains hard and unsightly. Assessed as vein irritation, not a blood clot. Discussed warm compresses to soften the area. - Apply warm compresses to the IV  site  Esophagitis Severe esophagitis confirmed by two endoscopies. Currently on Protonix once daily, down from twice daily. Recent endoscopy showed significant improvement. - Continue Protonix once daily  General Health Maintenance Blood pressure slightly lower than usual, likely due to not having eaten breakfast and being on menstrual cycle. - Monitor blood pressure as needed  Plan -Monitor lab CBC and ferritin every 2 months - Follow up in 8 months - Use MyChart to monitor ferritin levels - Contact clinic if ferritin <150 or symptoms recur.       Discussed the use of AI scribe software for clinical note transcription with the patient, who gave verbal consent to proceed.  History of Present Illness   The patient, a 32 year old female with a history of iron deficiency anemia, presents for follow-up. She reports feeling well after her last IV iron infusion in August, noting an improvement in symptoms for the first few weeks post-infusion. Recently, she has experienced a couple of migraines, which she associates with a drop in her ferritin levels. To supplement her iron intake, she has been taking a beef organ pill for about a month, which she believes may have contributed to her overall well-being.  In addition to her anemia, the patient has a history of esophagitis, for which she has undergone two endoscopies. The first revealed severe esophagitis, and the patient has since been taking Protonix. Her second endoscopy showed significant improvement, and she has reduced her Protonix dosage to once daily. She also reports a persistent hardness at the site of her last IV infusion, which she describes as a knot.  All other systems were reviewed with the patient and are negative.  MEDICAL HISTORY:  Past Medical History:  Diagnosis Date   ADD (attention deficit disorder)    Amenorrhea    Anemia    Anxiety    Chronic fatigue    Depression    Family history of breast cancer     Family history of colon cancer    Family history of ovarian cancer    Family history of pancreatic cancer    GERD (gastroesophageal reflux disease)    History of kidney stones    Insomnia    Kidney stone 09/02/2021   Late menses    Low iron    Missed period    Nephrolithiasis    Normal intrauterine pregnancy in third trimester 08/25/2014   Palpitations    Panic disorder    Retained products of conception, early pregnancy 01/17/2017    SURGICAL HISTORY: Past Surgical History:  Procedure Laterality Date   DILATION AND CURETTAGE OF UTERUS  01/14/2017   kidney stent     kidney stent removal and removal of kidney stone     UPPER GASTROINTESTINAL ENDOSCOPY  03/29/2023   Tiajuana Amass at [facility]    I have reviewed the social history and family history with the patient and they are unchanged from previous note.  ALLERGIES:  is allergic to bupropion.  MEDICATIONS:  Current Outpatient Medications  Medication Sig Dispense Refill   acetaminophen (TYLENOL) 500 MG tablet Take 500 mg by mouth every 6 (six) hours as needed for mild pain.     Cholecalciferol (VITAMIN D-1000 MAX ST) 25 MCG (1000 UT) tablet Take by mouth.     Multiple Vitamin (MULTI-VITAMIN) tablet Take 1 tablet by mouth daily.     pantoprazole (PROTONIX) 40 MG tablet Take 1 tablet (40 mg total) by mouth 2 (two) times daily. 90 tablet 3   SUMAtriptan (IMITREX) 25 MG tablet Take 1 tablet (25 mg total) by mouth 2 (two) times daily as needed, at least 2 hours between doses 20 tablet 5   Current Facility-Administered Medications  Medication Dose Route Frequency Provider Last Rate Last Admin   0.9 %  sodium chloride infusion  500 mL Intravenous Once Jenel Lucks, MD        PHYSICAL EXAMINATION: ECOG PERFORMANCE STATUS: 0 - Asymptomatic  Vitals:   06/06/23 0857  BP: (!) 113/52  Pulse: 65  Temp: 97.9 F (36.6 C)  SpO2: 98%   Wt Readings from Last 3 Encounters:  06/06/23 182 lb 1.6 oz (82.6 kg)  05/31/23 173 lb  (78.5 kg)  04/16/23 175 lb (79.4 kg)     GENERAL:alert, no distress and comfortable SKIN: skin color, texture, turgor are normal, no rashes or significant lesions EYES: normal, Conjunctiva are pink and non-injected, sclera clear Musculoskeletal:no cyanosis of digits and no clubbing  NEURO: alert & oriented x 3 with fluent speech, no focal motor/sensory deficits    LABORATORY DATA:  I have reviewed the data as listed    Latest Ref Rng & Units 06/06/2023    8:38 AM 04/23/2023    8:21 AM 03/08/2023    8:23 AM  CBC  WBC 4.0 - 10.5 K/uL 8.0  8.7  6.2   Hemoglobin 12.0 - 15.0 g/dL 84.6  96.2  95.2   Hematocrit 36.0 - 46.0 % 40.9  40.3  39.4   Platelets 150 - 400 K/uL 233  210  208         Latest Ref Rng & Units 09/02/2021  6:40 AM 02/22/2014    4:22 AM 02/21/2014    9:20 AM  CMP  Glucose 70 - 99 mg/dL 811  90  89   BUN 6 - 20 mg/dL 14  8  9    Creatinine 0.44 - 1.00 mg/dL 9.14  7.82  9.56   Sodium 135 - 145 mmol/L 138  135  136   Potassium 3.5 - 5.1 mmol/L 3.3  3.3  3.6   Chloride 98 - 111 mmol/L 106  101  100   CO2 22 - 32 mmol/L 22  22  21    Calcium 8.9 - 10.3 mg/dL 9.4  9.3  9.6   Total Protein 6.5 - 8.1 g/dL 7.6  6.1    Total Bilirubin 0.3 - 1.2 mg/dL 0.5  0.4    Alkaline Phos 38 - 126 U/L 64  44    AST 15 - 41 U/L 20  20    ALT 0 - 44 U/L 16  10        RADIOGRAPHIC STUDIES: I have personally reviewed the radiological images as listed and agreed with the findings in the report. No results found.    No orders of the defined types were placed in this encounter.  All questions were answered. The patient knows to call the clinic with any problems, questions or concerns. No barriers to learning was detected. The total time spent in the appointment was 20 minutes.     Malachy Mood, MD 06/06/2023

## 2023-06-13 DIAGNOSIS — H31092 Other chorioretinal scars, left eye: Secondary | ICD-10-CM | POA: Diagnosis not present

## 2023-06-13 DIAGNOSIS — H5213 Myopia, bilateral: Secondary | ICD-10-CM | POA: Diagnosis not present

## 2023-06-13 DIAGNOSIS — H52222 Regular astigmatism, left eye: Secondary | ICD-10-CM | POA: Diagnosis not present

## 2023-06-13 DIAGNOSIS — H35352 Cystoid macular degeneration, left eye: Secondary | ICD-10-CM | POA: Diagnosis not present

## 2023-06-24 DIAGNOSIS — Z130 Encounter for screening for diseases of the blood and blood-forming organs and certain disorders involving the immune mechanism: Secondary | ICD-10-CM | POA: Diagnosis not present

## 2023-06-24 DIAGNOSIS — Z124 Encounter for screening for malignant neoplasm of cervix: Secondary | ICD-10-CM | POA: Diagnosis not present

## 2023-06-24 DIAGNOSIS — Z1279 Encounter for screening for malignant neoplasm of other genitourinary organs: Secondary | ICD-10-CM | POA: Diagnosis not present

## 2023-06-24 DIAGNOSIS — Z01411 Encounter for gynecological examination (general) (routine) with abnormal findings: Secondary | ICD-10-CM | POA: Diagnosis not present

## 2023-06-24 DIAGNOSIS — N920 Excessive and frequent menstruation with regular cycle: Secondary | ICD-10-CM | POA: Diagnosis not present

## 2023-06-24 DIAGNOSIS — Z1151 Encounter for screening for human papillomavirus (HPV): Secondary | ICD-10-CM | POA: Diagnosis not present

## 2023-07-02 DIAGNOSIS — N920 Excessive and frequent menstruation with regular cycle: Secondary | ICD-10-CM | POA: Diagnosis not present

## 2023-08-02 ENCOUNTER — Encounter: Payer: Self-pay | Admitting: Hematology

## 2023-08-09 ENCOUNTER — Inpatient Hospital Stay: Payer: 59 | Attending: Hematology

## 2023-08-09 DIAGNOSIS — N920 Excessive and frequent menstruation with regular cycle: Secondary | ICD-10-CM | POA: Diagnosis not present

## 2023-08-09 DIAGNOSIS — D500 Iron deficiency anemia secondary to blood loss (chronic): Secondary | ICD-10-CM | POA: Insufficient documentation

## 2023-08-09 LAB — CBC WITH DIFFERENTIAL/PLATELET
Abs Immature Granulocytes: 0.02 10*3/uL (ref 0.00–0.07)
Basophils Absolute: 0.1 10*3/uL (ref 0.0–0.1)
Basophils Relative: 1 %
Eosinophils Absolute: 0.4 10*3/uL (ref 0.0–0.5)
Eosinophils Relative: 5 %
HCT: 40.8 % (ref 36.0–46.0)
Hemoglobin: 14.2 g/dL (ref 12.0–15.0)
Immature Granulocytes: 0 %
Lymphocytes Relative: 26 %
Lymphs Abs: 1.9 10*3/uL (ref 0.7–4.0)
MCH: 30.5 pg (ref 26.0–34.0)
MCHC: 34.8 g/dL (ref 30.0–36.0)
MCV: 87.7 fL (ref 80.0–100.0)
Monocytes Absolute: 0.5 10*3/uL (ref 0.1–1.0)
Monocytes Relative: 6 %
Neutro Abs: 4.6 10*3/uL (ref 1.7–7.7)
Neutrophils Relative %: 62 %
Platelets: 262 10*3/uL (ref 150–400)
RBC: 4.65 MIL/uL (ref 3.87–5.11)
RDW: 11.7 % (ref 11.5–15.5)
WBC: 7.5 10*3/uL (ref 4.0–10.5)
nRBC: 0 % (ref 0.0–0.2)

## 2023-08-09 LAB — FERRITIN: Ferritin: 155 ng/mL (ref 11–307)

## 2023-08-13 NOTE — Progress Notes (Signed)
Ferritin 155 with normal CBC. No need for IV iron

## 2023-08-30 DIAGNOSIS — R350 Frequency of micturition: Secondary | ICD-10-CM | POA: Diagnosis not present

## 2023-08-30 DIAGNOSIS — Z87442 Personal history of urinary calculi: Secondary | ICD-10-CM | POA: Diagnosis not present

## 2023-08-30 DIAGNOSIS — R635 Abnormal weight gain: Secondary | ICD-10-CM | POA: Diagnosis not present

## 2023-09-10 ENCOUNTER — Encounter: Payer: Self-pay | Admitting: Hematology

## 2023-09-10 ENCOUNTER — Other Ambulatory Visit (HOSPITAL_COMMUNITY): Payer: Self-pay

## 2023-09-10 MED ORDER — PANTOPRAZOLE SODIUM 20 MG PO TBEC
20.0000 mg | DELAYED_RELEASE_TABLET | Freq: Every day | ORAL | 11 refills | Status: AC
  Filled 2023-09-10: qty 30, 30d supply, fill #0
  Filled 2024-03-07: qty 30, 30d supply, fill #1
  Filled 2024-05-13: qty 30, 30d supply, fill #2
  Filled 2024-08-01: qty 30, 30d supply, fill #3

## 2023-09-24 DIAGNOSIS — Z87442 Personal history of urinary calculi: Secondary | ICD-10-CM | POA: Diagnosis not present

## 2023-09-24 DIAGNOSIS — R300 Dysuria: Secondary | ICD-10-CM | POA: Diagnosis not present

## 2023-09-26 DIAGNOSIS — R300 Dysuria: Secondary | ICD-10-CM | POA: Diagnosis not present

## 2023-10-01 ENCOUNTER — Other Ambulatory Visit (HOSPITAL_COMMUNITY): Payer: Self-pay

## 2023-10-01 MED ORDER — CEPHALEXIN 500 MG PO CAPS
500.0000 mg | ORAL_CAPSULE | Freq: Two times a day (BID) | ORAL | 0 refills | Status: DC
  Filled 2023-10-01: qty 10, 5d supply, fill #0

## 2023-10-07 ENCOUNTER — Other Ambulatory Visit: Payer: Self-pay

## 2023-10-07 DIAGNOSIS — D500 Iron deficiency anemia secondary to blood loss (chronic): Secondary | ICD-10-CM

## 2023-10-08 ENCOUNTER — Inpatient Hospital Stay: Payer: 59

## 2023-10-10 ENCOUNTER — Telehealth: Payer: Self-pay | Admitting: Hematology

## 2023-10-10 NOTE — Telephone Encounter (Signed)
Patient is aware of rescheduled appointment times/dates 

## 2023-10-11 ENCOUNTER — Inpatient Hospital Stay: Attending: Hematology

## 2023-10-11 DIAGNOSIS — N920 Excessive and frequent menstruation with regular cycle: Secondary | ICD-10-CM | POA: Insufficient documentation

## 2023-10-11 DIAGNOSIS — D500 Iron deficiency anemia secondary to blood loss (chronic): Secondary | ICD-10-CM | POA: Diagnosis not present

## 2023-10-11 LAB — CBC WITH DIFFERENTIAL (CANCER CENTER ONLY)
Abs Immature Granulocytes: 0.01 10*3/uL (ref 0.00–0.07)
Basophils Absolute: 0.1 10*3/uL (ref 0.0–0.1)
Basophils Relative: 1 %
Eosinophils Absolute: 0.3 10*3/uL (ref 0.0–0.5)
Eosinophils Relative: 5 %
HCT: 40.3 % (ref 36.0–46.0)
Hemoglobin: 13.8 g/dL (ref 12.0–15.0)
Immature Granulocytes: 0 %
Lymphocytes Relative: 26 %
Lymphs Abs: 1.5 10*3/uL (ref 0.7–4.0)
MCH: 30.3 pg (ref 26.0–34.0)
MCHC: 34.2 g/dL (ref 30.0–36.0)
MCV: 88.4 fL (ref 80.0–100.0)
Monocytes Absolute: 0.3 10*3/uL (ref 0.1–1.0)
Monocytes Relative: 5 %
Neutro Abs: 3.7 10*3/uL (ref 1.7–7.7)
Neutrophils Relative %: 63 %
Platelet Count: 261 10*3/uL (ref 150–400)
RBC: 4.56 MIL/uL (ref 3.87–5.11)
RDW: 12 % (ref 11.5–15.5)
WBC Count: 5.9 10*3/uL (ref 4.0–10.5)
nRBC: 0 % (ref 0.0–0.2)

## 2023-10-11 LAB — FERRITIN: Ferritin: 118 ng/mL (ref 11–307)

## 2023-10-14 ENCOUNTER — Other Ambulatory Visit: Payer: Self-pay | Admitting: Nurse Practitioner

## 2023-10-14 ENCOUNTER — Telehealth: Payer: Self-pay

## 2023-10-14 NOTE — Progress Notes (Signed)
 Ordered venofer 400 mg once to be administered at [facility].

## 2023-10-14 NOTE — Telephone Encounter (Signed)
 Attempted to contact patient via telephone call, per Dr. Mosetta Putt. Unable to reach patient. LVM stating her ferritin is low, and to be on the lookout for a phone call from [facility] to schedule IV Iron in next few weeks.

## 2023-10-18 ENCOUNTER — Other Ambulatory Visit: Payer: Self-pay

## 2023-10-18 ENCOUNTER — Telehealth: Payer: Self-pay

## 2023-10-18 ENCOUNTER — Encounter: Payer: Self-pay | Admitting: Hematology

## 2023-10-18 NOTE — Telephone Encounter (Signed)
 Received a message from the patient via MyChart. Patient stated she had not heard from [facility] regarding scheduling IV iron.  Let patient know the order was in. Provided patient with [facility] T# [phone] and address: [address]  Patient voiced understanding.

## 2023-10-22 ENCOUNTER — Telehealth: Payer: Self-pay | Admitting: Pharmacy Technician

## 2023-10-22 ENCOUNTER — Other Ambulatory Visit: Payer: Self-pay

## 2023-10-22 NOTE — Telephone Encounter (Signed)
 Auth Submission: NO AUTH NEEDED Site of care: Site of care: [facility] Payer: [employer] Medication & CPT/J Code(s) submitted: Venofer (Iron Sucrose) J1756 Route of submission (phone, fax, portal):  Phone # Fax # Auth type: Buy/Bill PB Units/visits requested: X1 DOSE Reference number:  Approval from: 10/22/23 to 02/21/24

## 2023-10-31 ENCOUNTER — Ambulatory Visit (INDEPENDENT_AMBULATORY_CARE_PROVIDER_SITE_OTHER)

## 2023-10-31 VITALS — BP 118/71 | HR 75 | Temp 98.3°F | Resp 18 | Ht 63.0 in | Wt 189.0 lb

## 2023-10-31 DIAGNOSIS — N920 Excessive and frequent menstruation with regular cycle: Secondary | ICD-10-CM

## 2023-10-31 DIAGNOSIS — D500 Iron deficiency anemia secondary to blood loss (chronic): Secondary | ICD-10-CM | POA: Diagnosis not present

## 2023-10-31 MED ORDER — SODIUM CHLORIDE 0.9 % IV SOLN
400.0000 mg | Freq: Once | INTRAVENOUS | Status: AC
  Administered 2023-10-31: 400 mg via INTRAVENOUS
  Filled 2023-10-31: qty 20

## 2023-10-31 MED ORDER — IRON SUCROSE 400 MG IVPB - SIMPLE MED: 400.0000 mg | Freq: Once | Status: DC

## 2023-10-31 NOTE — Progress Notes (Signed)
 Diagnosis: Iron Deficiency Anemia  Provider:  Chilton Greathouse MD  Procedure: IV Infusion  IV Type: Peripheral, IV Location: R Antecubital  Venofer (Iron Sucrose), Dose: 400 mg  Infusion Start Time: 1136  Infusion Stop Time: 1437  Post Infusion IV Care: Patient declined observation and Peripheral IV Discontinued  Discharge: Condition: Good, Destination: Home . AVS Declined  Performed by:  Rico Ala, LPN

## 2023-11-14 ENCOUNTER — Other Ambulatory Visit (HOSPITAL_COMMUNITY): Payer: Self-pay

## 2023-11-14 ENCOUNTER — Other Ambulatory Visit: Payer: Self-pay

## 2023-11-14 ENCOUNTER — Ambulatory Visit: Payer: 59 | Admitting: Internal Medicine

## 2023-11-14 VITALS — BP 110/72 | HR 68 | Temp 97.7°F | Resp 16 | Ht 63.0 in | Wt 187.6 lb

## 2023-11-14 DIAGNOSIS — D500 Iron deficiency anemia secondary to blood loss (chronic): Secondary | ICD-10-CM

## 2023-11-14 DIAGNOSIS — H3530 Unspecified macular degeneration: Secondary | ICD-10-CM

## 2023-11-14 DIAGNOSIS — G43711 Chronic migraine without aura, intractable, with status migrainosus: Secondary | ICD-10-CM

## 2023-11-14 DIAGNOSIS — Z8719 Personal history of other diseases of the digestive system: Secondary | ICD-10-CM | POA: Diagnosis not present

## 2023-11-14 DIAGNOSIS — E559 Vitamin D deficiency, unspecified: Secondary | ICD-10-CM | POA: Diagnosis not present

## 2023-11-14 MED ORDER — SUMATRIPTAN SUCCINATE 50 MG PO TABS
50.0000 mg | ORAL_TABLET | ORAL | 1 refills | Status: AC | PRN
  Filled 2023-11-14: qty 10, 30d supply, fill #0

## 2023-11-14 NOTE — Progress Notes (Signed)
 New Patient Office Visit  Subjective    Patient ID: Renee Sanchez, female    DOB: 09/07/1990  Age: 33 y.o. MRN: [id number]  CC:  Chief Complaint  Patient presents with   Establish Care    HPI Renee Sanchez presents to establish care.  Discussed the use of AI scribe software for clinical note transcription with the patient, who gave verbal consent to proceed.  History of Present Illness The patient, with a history of esophagitis, hiatal hernia, and macular degeneration, presents with a chief complaint of migraines. She reports that the migraines started about five years ago and occur at least once a week. The patient notes that the frequency of migraines has decreased from three times a week since starting iron  infusions for low ferritin levels. The migraines typically occur in the evening and the patient has not found relief with sumatriptan  25mg .  In addition to migraines, the patient has been experiencing significant weight gain since starting Protonix  for acid reflux due to esophagitis. Despite regular exercise and a high protein diet, the patient has gained about 20 pounds since September. The patient also reports a history of ADHD, for which she is not currently medicated, and a history of depression, which she attributes to chronic fatigue rather than a lack of motivation.  The patient also has a history of macular degeneration in one eye, which has been treated with multiple injections and is currently stable. She also reports having scars on the same eye, the cause of which is unknown.   MIGRAINES Duration:  5 years ago after epidural   at least once a week Onset: sudden Severity: severe Frequency:  at least once weekly Headache status at time of visit: asymptomatic Treatments attempted: Treatments attempted: triptans    Iron  Deficiency: -Last CBC 3/25 hemoglobin ferritin 118  -Following with Hematology for iron  infusions, has labs set up for next month  Vitamin D  Deficiency:  -Vitamin D 4/24 36, was taking 10,000 international units  at the time, now on Vitamin D 5000    Health Maintenance: -Blood work UTD  Outpatient Encounter Medications as of 11/14/2023  Medication Sig   acetaminophen  (TYLENOL ) 500 MG tablet Take 500 mg by mouth every 6 (six) hours as needed for mild pain.   Cholecalciferol (VITAMIN D-1000 MAX ST) 25 MCG (1000 UT) tablet Take by mouth.   Multiple Vitamin (MULTI-VITAMIN) tablet Take 1 tablet by mouth daily.   pantoprazole  (PROTONIX ) 20 MG tablet Take 1 tablet (20 mg total) by mouth daily.   cephALEXin  (KEFLEX ) 500 MG capsule Take 1 capsule (500 mg total) by mouth 2 (two) times daily. (Patient not taking: Reported on 11/14/2023)   pantoprazole  (PROTONIX ) 40 MG tablet Take 1 tablet (40 mg total) by mouth 2 (two) times daily. (Patient not taking: Reported on 11/14/2023)   SUMAtriptan  (IMITREX ) 25 MG tablet Take 1 tablet (25 mg total) by mouth 2 (two) times daily as needed, at least 2 hours between doses (Patient not taking: Reported on 11/14/2023)   Facility-Administered Encounter Medications as of 11/14/2023  Medication   0.9 %  sodium chloride  infusion    Past Medical History:  Diagnosis Date   ADD (attention deficit disorder)    Amenorrhea    Anemia    Anxiety    Chronic fatigue    Depression    Family history of breast cancer    Family history of colon cancer    Family history of ovarian cancer    Family history of  pancreatic cancer    GERD (gastroesophageal reflux disease)    History of kidney stones    Insomnia    Kidney stone 09/02/2021   Late menses    Low iron     Macular degeneration disease    Migraine    Missed period    Nephrolithiasis    Normal intrauterine pregnancy in third trimester 08/25/2014   Palpitations    Panic disorder    Retained products of conception, early pregnancy 01/17/2017    Past Surgical History:  Procedure Laterality Date   DILATION AND CURETTAGE OF UTERUS  01/14/2017   kidney  stent     kidney stent removal and removal of kidney stone     UPPER GASTROINTESTINAL ENDOSCOPY  03/29/2023   Darol Elizabeth at [facility]    Family History  Problem Relation Age of Onset   Varicose Veins Mother    ADD / ADHD Father    Alcohol abuse Father    Drug abuse Father    Anxiety disorder Father    Depression Father    ADD / ADHD Brother    Uterine cancer Paternal Aunt    Cervical cancer Paternal Aunt        dx 51s, genetic test + for colon gene   Pancreatic cancer Paternal Uncle 21   Breast cancer Maternal Grandmother        late 60s-70s   Arthritis Paternal Grandmother    COPD Paternal Grandmother    Diabetes Paternal Grandmother    Breast cancer Paternal Grandmother 30   Cancer Paternal Grandmother        fallopian tube dx 60   Cancer - Other Paternal Grandmother        tongue cancer dx 70   Colon polyps Neg Hx    Colon cancer Neg Hx    Esophageal cancer Neg Hx    Rectal cancer Neg Hx    Stomach cancer Neg Hx     Social History   Socioeconomic History   Marital status: Married    Spouse name: Carolynne Citron   Number of children: 2   Years of education: Not on file   Highest education level: Associate degree: occupational, Scientist, product/process development, or vocational program  Occupational History   Not on file  Tobacco Use   Smoking status: Former   Smokeless tobacco: Never  Vaping Use   Vaping status: Never Used  Substance and Sexual Activity   Alcohol use: Not Currently    Comment: social   Drug use: No   Sexual activity: Yes    Birth control/protection: None  Other Topics Concern   Not on file  Social History Narrative   Not on file   Social Drivers of Health   Financial Resource Strain: Medium Risk (11/14/2023)   Overall Financial Resource Strain (CARDIA)    Difficulty of Paying Living Expenses: Somewhat hard  Food Insecurity: No Food Insecurity (11/14/2023)   Hunger Vital Sign    Worried About Running Out of Food in the Last Year: Never true    Ran Out of Food in the  Last Year: Never true  Transportation Needs: No Transportation Needs (11/14/2023)   PRAPARE - Administrator, Civil Service (Medical): No    Lack of Transportation (Non-Medical): No  Physical Activity: Insufficiently Active (11/14/2023)   Exercise Vital Sign    Days of Exercise per Week: 4 days    Minutes of Exercise per Session: 30 min  Stress: Stress Concern Present (11/14/2023)   [employer] of Occupational  Health - Occupational Stress Questionnaire    Feeling of Stress : To some extent  Social Connections: Socially Integrated (11/14/2023)   Social Connection and Isolation Panel [NHANES]    Frequency of Communication with Friends and Family: Three times a week    Frequency of Social Gatherings with Friends and Family: Once a week    Attends Religious Services: More than 4 times per year    Active Member of [employer] or Organizations: Yes    Attends Engineer, structural: More than 4 times per year    Marital Status: Married  Catering manager Violence: Not At Risk (04/18/2019)   Humiliation, Afraid, Rape, and Kick questionnaire    Fear of Current or Ex-Partner: No    Emotionally Abused: No    Physically Abused: No    Sexually Abused: No    Review of Systems  All other systems reviewed and are negative.       Objective    BP 110/72 (Cuff Size: Normal)   Pulse 68   Temp 97.7 F (36.5 C) (Oral)   Resp 16   Ht 5\' 3"  (1.6 m)   Wt 187 lb 9.6 oz (85.1 kg)   LMP 10/31/2023   SpO2 99%   BMI 33.23 kg/m   Physical Exam Constitutional:      Appearance: Normal appearance.  HENT:     Head: Normocephalic and atraumatic.     Mouth/Throat:     Mouth: Mucous membranes are moist.     Pharynx: Oropharynx is clear.  Eyes:     Extraocular Movements: Extraocular movements intact.     Conjunctiva/sclera: Conjunctivae normal.     Pupils: Pupils are equal, round, and reactive to light.  Neck:     Comments: No thyromegaly Cardiovascular:     Rate and Rhythm:  Normal rate and regular rhythm.  Pulmonary:     Effort: Pulmonary effort is normal.     Breath sounds: Normal breath sounds.  Musculoskeletal:     Cervical back: No tenderness.     Right lower leg: No edema.     Left lower leg: No edema.  Lymphadenopathy:     Cervical: No cervical adenopathy.  Skin:    General: Skin is warm and dry.  Neurological:     General: No focal deficit present.     Mental Status: She is alert. Mental status is at baseline.  Psychiatric:        Mood and Affect: Mood normal.        Behavior: Behavior normal.     Last CBC Lab Results  Component Value Date   WBC 5.9 10/11/2023   HGB 13.8 10/11/2023   HCT 40.3 10/11/2023   MCV 88.4 10/11/2023   MCH 30.3 10/11/2023   RDW 12.0 10/11/2023   PLT 261 10/11/2023   Last metabolic panel Lab Results  Component Value Date   GLUCOSE 139 (H) 09/02/2021   NA 138 09/02/2021   K 3.3 (L) 09/02/2021   CL 106 09/02/2021   CO2 22 09/02/2021   BUN 14 09/02/2021   CREATININE 0.93 09/02/2021   GFRNONAA >60 09/02/2021   CALCIUM 9.4 09/02/2021   PROT 7.6 09/02/2021   ALBUMIN 4.2 09/02/2021   BILITOT 0.5 09/02/2021   ALKPHOS 64 09/02/2021   AST 20 09/02/2021   ALT 16 09/02/2021   ANIONGAP 10 09/02/2021   Last lipids No results found for: "CHOL", "HDL", "LDLCALC", "LDLDIRECT", "TRIG", "CHOLHDL" Last hemoglobin A1c No results found for: "HGBA1C" Last thyroid  functions No results  found for: "TSH", "T3TOTAL", "T4TOTAL", "THYROIDAB" Last vitamin D No results found for: "25OHVITD2", "25OHVITD3", "VD25OH" Last vitamin B12 and Folate No results found for: "VITAMINB12", "FOLATE"      Assessment & Plan:   Assessment & Plan Migraine Chronic migraines weekly since 2020, possibly linked to epidural complications. Sumatriptan  25 mg ineffective. Prefers abortive therapies over daily medications. Discussed increasing sumatriptan  or switching to Maxalt. Preventative medications not preferred due to cost and treatment  failure requirements. - Increase sumatriptan  to 50 mg, take one at onset, second if symptoms persist after two hours, max 100 mg in 24 hours. - Consider Maxalt if increased dose ineffective. - Follow up if migraines persist or worsen.  Iron  deficiency anemia Managed with regular iron  infusions. Ferritin levels monitored, threshold of 150 for infusions. Recent infusion on October 31, 2023. Energy levels and symptoms improved. - Continue iron  infusions based on ferritin levels. - Monitor ferritin levels every two months. - Follow up with hematologist on February 04, 2024.  Esophagitis and hiatal hernia Previously treated with high-dose Protonix . Symptoms improved but weight gain noted. Attempting to wean off Protonix  with symptom recurrence. Discussed Protonix  impact on gut flora and nutrient absorption. - Consider probiotic such as Align. - Adjust Protonix  dosage based on symptoms. - Discuss vitamin D levels with hematologist next visit.  Macular degeneration Left eye macular degeneration managed with injections. Well-managed with regular eye specialist follow-ups. - Continue regular follow-ups with eye specialist at [facility].  - SUMAtriptan  (IMITREX ) 50 MG tablet; Take 1 tablet (50 mg total) by mouth every 2 (two) hours as needed for migraine. May repeat in 2 hours if headache persists or recurs.  Dispense: 10 tablet; Refill: 1   Return in about 6 months (around 05/15/2024).   Rockney Cid, DO

## 2023-12-05 ENCOUNTER — Other Ambulatory Visit: Payer: Self-pay

## 2023-12-05 DIAGNOSIS — D500 Iron deficiency anemia secondary to blood loss (chronic): Secondary | ICD-10-CM

## 2023-12-06 ENCOUNTER — Inpatient Hospital Stay: Payer: 59 | Attending: Hematology

## 2023-12-12 ENCOUNTER — Other Ambulatory Visit (HOSPITAL_COMMUNITY): Payer: Self-pay

## 2023-12-12 MED ORDER — PROGESTERONE MICRONIZED 100 MG PO CAPS
100.0000 mg | ORAL_CAPSULE | Freq: Every day | ORAL | 1 refills | Status: AC
  Filled 2023-12-12: qty 90, 90d supply, fill #0
  Filled 2024-08-01: qty 90, 90d supply, fill #1

## 2023-12-17 ENCOUNTER — Other Ambulatory Visit (HOSPITAL_COMMUNITY): Payer: Self-pay

## 2024-01-14 ENCOUNTER — Other Ambulatory Visit (HOSPITAL_COMMUNITY): Payer: Self-pay

## 2024-01-14 MED ORDER — PENICILLIN V POTASSIUM 500 MG PO TABS
500.0000 mg | ORAL_TABLET | Freq: Every day | ORAL | 0 refills | Status: DC
  Filled 2024-01-14: qty 30, 7d supply, fill #0

## 2024-01-28 DIAGNOSIS — M25512 Pain in left shoulder: Secondary | ICD-10-CM | POA: Diagnosis not present

## 2024-02-03 NOTE — Assessment & Plan Note (Signed)
-  Per patient, she has had iron deficiency related symptoms for many years, including fatigue, palpitation, dyspnea on exertion, insomnia and anxiety.  -Her recent lab showed ferritin 10, increased TIBC, consistent with iron deficiency.  She has normal CBC, no anemia or microcytosis. -Her iron deficiency is secondary to her menorrhagia -She has tried oral iron for many years, could not tolerate due to GI symptoms. -She received IV iron Venofer for total of 1 g in February and March 2024. -Upper endoscopy in September 2024 showed esophagitis, she was treated with PPI and resolved on follow-up EGD. -Due to her symptomatic iron deficiency, will give IV iron if ferritin less than 150.

## 2024-02-04 ENCOUNTER — Inpatient Hospital Stay: Payer: 59

## 2024-02-04 ENCOUNTER — Inpatient Hospital Stay: Payer: 59 | Attending: Hematology | Admitting: Hematology

## 2024-02-04 VITALS — BP 110/60 | HR 76 | Temp 98.1°F | Resp 15 | Ht 63.0 in | Wt 192.6 lb

## 2024-02-04 DIAGNOSIS — D500 Iron deficiency anemia secondary to blood loss (chronic): Secondary | ICD-10-CM | POA: Diagnosis not present

## 2024-02-04 DIAGNOSIS — M25512 Pain in left shoulder: Secondary | ICD-10-CM | POA: Diagnosis not present

## 2024-02-04 DIAGNOSIS — N920 Excessive and frequent menstruation with regular cycle: Secondary | ICD-10-CM | POA: Diagnosis not present

## 2024-02-04 DIAGNOSIS — Z8041 Family history of malignant neoplasm of ovary: Secondary | ICD-10-CM | POA: Diagnosis not present

## 2024-02-04 DIAGNOSIS — Z803 Family history of malignant neoplasm of breast: Secondary | ICD-10-CM | POA: Diagnosis not present

## 2024-02-04 LAB — CBC WITH DIFFERENTIAL (CANCER CENTER ONLY)
Abs Immature Granulocytes: 0.02 K/uL (ref 0.00–0.07)
Basophils Absolute: 0.1 K/uL (ref 0.0–0.1)
Basophils Relative: 1 %
Eosinophils Absolute: 0.3 K/uL (ref 0.0–0.5)
Eosinophils Relative: 4 %
HCT: 38.6 % (ref 36.0–46.0)
Hemoglobin: 13.4 g/dL (ref 12.0–15.0)
Immature Granulocytes: 0 %
Lymphocytes Relative: 23 %
Lymphs Abs: 1.7 K/uL (ref 0.7–4.0)
MCH: 30.6 pg (ref 26.0–34.0)
MCHC: 34.7 g/dL (ref 30.0–36.0)
MCV: 88.1 fL (ref 80.0–100.0)
Monocytes Absolute: 0.5 K/uL (ref 0.1–1.0)
Monocytes Relative: 7 %
Neutro Abs: 4.8 K/uL (ref 1.7–7.7)
Neutrophils Relative %: 65 %
Platelet Count: 234 K/uL (ref 150–400)
RBC: 4.38 MIL/uL (ref 3.87–5.11)
RDW: 12.3 % (ref 11.5–15.5)
WBC Count: 7.3 K/uL (ref 4.0–10.5)
nRBC: 0 % (ref 0.0–0.2)

## 2024-02-04 LAB — FERRITIN: Ferritin: 256 ng/mL (ref 11–307)

## 2024-02-04 NOTE — Progress Notes (Signed)
 [facility]   [phone] [phone]   Clinic Follow up Note   Patient Care Team: Bernardo Fend, DO as PCP - General (Internal Medicine) Jeffrie Oneil BROCKS, MD as PCP - Cardiology (Cardiology)  Date of Service:  02/04/2024  CHIEF COMPLAINT: f/u of iron  deficient anemia  CURRENT THERAPY:  IV iron  as needed if ferritin less than 50  Oncology History   Iron  deficiency anemia due to chronic blood loss -Per patient, she has had iron  deficiency related symptoms for many years, including fatigue, palpitation, dyspnea on exertion, insomnia and anxiety.  -Her recent lab showed ferritin 10, increased TIBC, consistent with iron  deficiency.  She has normal CBC, no anemia or microcytosis. -Her iron  deficiency is secondary to her menorrhagia -She has tried oral iron  for many years, could not tolerate due to GI symptoms. -She received IV iron  Venofer  for total of 1 g in February and March 2024. -Upper endoscopy in September 2024 showed esophagitis, she was treated with PPI and resolved on follow-up EGD. -Due to her symptomatic iron  deficiency, she used to have IV iron  if ferritin less than 150. I dropped to ferritin<50 in 01/2024   Assessment & Plan Iron  deficiency anemia Iron  deficiency anemia with heavy menstrual bleeding. Ferritin level elevated to 317 on Dec 04, 2023, after IV iron  infusion on October 31, 2023, attributed to recent high-dose iron  infusion. Symptoms improved with hormone therapy, including testosterone  and progesterone , reducing headaches. Reports heavy menstrual periods, slightly improved. - Reduce IV iron  administration threshold to 100 or 50, depending on symptoms. - Monitor iron  levels every 3-4 months. - Follow up in one year unless symptoms worsen. - Review lab results via MyChart and contact office if concerns arise.  Plan - She is clinically doing well - Continue to monitor lab every 3 months, and set up IV iron  if ferritin less than  50 - Follow-up in a year    Discussed the use of AI scribe software for clinical note transcription with the patient, who gave verbal consent to proceed.  History of Present Illness Renee Sanchez is a 33 year old female with iron  deficiency anemia who presents for follow-up.  Her ferritin level is 317 ng/mL, measured on Dec 04, 2023, one month after her last IV iron  infusion on October 31, 2023. This level is higher than previous measurements, which have not exceeded 200 ng/mL despite multiple iron  infusions. She feels better post-infusion but continues to experience intermittent headaches.  She is on testosterone  and progesterone  therapy, which has coincided with an improvement in headaches. Her current medications include a weekly 1 mg subcutaneous injection of testosterone  from a compounding pharmacy.  She has regular menstrual periods that are heavy but have improved slightly in terms of clotting. Previously, she experienced very heavy clots.     All other systems were reviewed with the patient and are negative.  MEDICAL HISTORY:  Past Medical History:  Diagnosis Date   ADD (attention deficit disorder)    Amenorrhea    Anemia    Anxiety    Chronic fatigue    Depression    Family history of breast cancer    Family history of colon cancer    Family history of ovarian cancer    Family history of pancreatic cancer    GERD (gastroesophageal reflux disease)    History of kidney stones    Insomnia    Kidney stone 09/02/2021   Late menses    Low iron     Macular  degeneration disease    Migraine    Missed period    Nephrolithiasis    Normal intrauterine pregnancy in third trimester 08/25/2014   Palpitations    Panic disorder    Retained products of conception, early pregnancy 01/17/2017    SURGICAL HISTORY: Past Surgical History:  Procedure Laterality Date   DILATION AND CURETTAGE OF UTERUS  01/14/2017   kidney stent     kidney stent removal and removal of kidney stone      UPPER GASTROINTESTINAL ENDOSCOPY  03/29/2023   Glendia Holt at [facility]    I have reviewed the social history and family history with the patient and they are unchanged from previous note.  ALLERGIES:  is allergic to bupropion.  MEDICATIONS:  Current Outpatient Medications  Medication Sig Dispense Refill   acetaminophen  (TYLENOL ) 500 MG tablet Take 500 mg by mouth every 6 (six) hours as needed for mild pain.     Cholecalciferol (VITAMIN D-1000 MAX ST) 25 MCG (1000 UT) tablet Take by mouth.     Multiple Vitamin (MULTI-VITAMIN) tablet Take 1 tablet by mouth daily.     pantoprazole  (PROTONIX ) 20 MG tablet Take 1 tablet (20 mg total) by mouth daily. 30 tablet 11   penicillin  v potassium (VEETID) 500 MG tablet Take 2 tablets (1000 mg total) by mouth now, then 1 tablet 4 times daily. 30 tablet 0   progesterone  (PROMETRIUM ) 100 MG capsule Take 1 capsule (100 mg total) by mouth at bedtime. 90 capsule 1   SUMAtriptan  (IMITREX ) 50 MG tablet Take 1 tablet (50 mg total) by mouth every 2 (two) hours as needed for migraine. May repeat in 2 hours if headache persists or recurs. 10 tablet 1   Current Facility-Administered Medications  Medication Dose Route Frequency Provider Last Rate Last Admin   0.9 %  sodium chloride  infusion  500 mL Intravenous Once Holt Glendia BRAVO, MD        PHYSICAL EXAMINATION: ECOG PERFORMANCE STATUS: 0 - Asymptomatic  Vitals:   02/04/24 0854  BP: 110/60  Pulse: 76  Resp: 15  Temp: 98.1 F (36.7 C)  SpO2: 99%   Wt Readings from Last 3 Encounters:  02/04/24 192 lb 9.6 oz (87.4 kg)  11/14/23 187 lb 9.6 oz (85.1 kg)  10/31/23 189 lb (85.7 kg)     GENERAL:alert, no distress and comfortable SKIN: skin color, texture, turgor are normal, no rashes or significant lesions EYES: normal, Conjunctiva are pink and non-injected, sclera clear Musculoskeletal:no cyanosis of digits and no clubbing  NEURO: alert & oriented x 3 with fluent speech, no focal motor/sensory  deficits  Physical Exam    LABORATORY DATA:  I have reviewed the data as listed    Latest Ref Rng & Units 02/04/2024    8:34 AM 10/11/2023    8:40 AM 08/09/2023    8:15 AM  CBC  WBC 4.0 - 10.5 K/uL 7.3  5.9  7.5   Hemoglobin 12.0 - 15.0 g/dL 86.5  86.1  85.7   Hematocrit 36.0 - 46.0 % 38.6  40.3  40.8   Platelets 150 - 400 K/uL 234  261  262         Latest Ref Rng & Units 09/02/2021    6:40 AM 02/22/2014    4:22 AM 02/21/2014    9:20 AM  CMP  Glucose 70 - 99 mg/dL 860  90  89   BUN 6 - 20 mg/dL 14  8  9    Creatinine 0.44 - 1.00 mg/dL 9.06  0.66  0.60   Sodium 135 - 145 mmol/L 138  135  136   Potassium 3.5 - 5.1 mmol/L 3.3  3.3  3.6   Chloride 98 - 111 mmol/L 106  101  100   CO2 22 - 32 mmol/L 22  22  21    Calcium 8.9 - 10.3 mg/dL 9.4  9.3  9.6   Total Protein 6.5 - 8.1 g/dL 7.6  6.1    Total Bilirubin 0.3 - 1.2 mg/dL 0.5  0.4    Alkaline Phos 38 - 126 U/L 64  44    AST 15 - 41 U/L 20  20    ALT 0 - 44 U/L 16  10        RADIOGRAPHIC STUDIES: I have personally reviewed the radiological images as listed and agreed with the findings in the report. No results found.    No orders of the defined types were placed in this encounter.  All questions were answered. The patient knows to call the clinic with any problems, questions or concerns. No barriers to learning was detected. The total time spent in the appointment was 15 minutes, including review of chart and various tests results, discussions about plan of care and coordination of care plan     Onita Mattock, MD 02/04/2024

## 2024-03-03 ENCOUNTER — Encounter (HOSPITAL_COMMUNITY): Payer: Self-pay

## 2024-03-03 ENCOUNTER — Other Ambulatory Visit (HOSPITAL_COMMUNITY): Payer: Self-pay

## 2024-03-03 MED ORDER — ZEPBOUND 2.5 MG/0.5ML ~~LOC~~ SOAJ: 2.5000 mg | SUBCUTANEOUS | 0 refills | Status: DC

## 2024-03-05 ENCOUNTER — Encounter: Payer: Self-pay | Admitting: Family Medicine

## 2024-03-05 ENCOUNTER — Ambulatory Visit: Admitting: Family Medicine

## 2024-03-05 ENCOUNTER — Other Ambulatory Visit (HOSPITAL_COMMUNITY): Payer: Self-pay

## 2024-03-05 VITALS — BP 124/68 | HR 77 | Temp 98.3°F | Resp 16 | Ht 63.0 in | Wt 189.0 lb

## 2024-03-05 DIAGNOSIS — R002 Palpitations: Secondary | ICD-10-CM

## 2024-03-05 DIAGNOSIS — R61 Generalized hyperhidrosis: Secondary | ICD-10-CM

## 2024-03-05 DIAGNOSIS — R109 Unspecified abdominal pain: Secondary | ICD-10-CM

## 2024-03-05 DIAGNOSIS — R232 Flushing: Secondary | ICD-10-CM

## 2024-03-05 DIAGNOSIS — R300 Dysuria: Secondary | ICD-10-CM

## 2024-03-05 DIAGNOSIS — Z87442 Personal history of urinary calculi: Secondary | ICD-10-CM | POA: Diagnosis not present

## 2024-03-05 DIAGNOSIS — R350 Frequency of micturition: Secondary | ICD-10-CM

## 2024-03-05 DIAGNOSIS — Z87440 Personal history of urinary (tract) infections: Secondary | ICD-10-CM

## 2024-03-05 DIAGNOSIS — R319 Hematuria, unspecified: Secondary | ICD-10-CM

## 2024-03-05 LAB — POCT URINALYSIS DIPSTICK
Appearance: NORMAL
Bilirubin, UA: NEGATIVE
Glucose, UA: NEGATIVE
Ketones, UA: NEGATIVE
Nitrite, UA: NEGATIVE
Protein, UA: POSITIVE — AB
Spec Grav, UA: 1.015 (ref 1.010–1.025)
Urobilinogen, UA: 0.2 U/dL
pH, UA: 7.5 (ref 5.0–8.0)

## 2024-03-05 MED ORDER — CEPHALEXIN 500 MG PO CAPS
500.0000 mg | ORAL_CAPSULE | Freq: Two times a day (BID) | ORAL | 0 refills | Status: DC
  Filled 2024-03-05: qty 10, 5d supply, fill #0

## 2024-03-05 NOTE — Patient Instructions (Signed)
 Recommend we first see if you have a culture positive urinary tract infection and if either there is no infection, or if you do have an infection and we treat it appropriately with antibiotics, I recommend you follow up if you continue to have hot flashes.  Right now its most likely related to your urinary symptoms and could resolve with treatment.  Follow up with PCP for continued sx or unresolved urinary symptoms.

## 2024-03-05 NOTE — Progress Notes (Signed)
 Patient ID: Renee Sanchez, female    DOB: 02/03/1991, 33 y.o.   MRN: [id number]  PCP: Bernardo Fend, DO  Chief Complaint  Patient presents with   Dysuria    w/ frequency and odor x2 weeks. OTC meds not helping.    Fatigue    w/ hot flashes x1 week.    Subjective:   Renee Sanchez is a 33 y.o. female, presents to clinic with CC of the following:  HPI  Here with urinary sx frequency odor x 2 weeks, hx of UTI and kidney stones Results for orders placed or performed in visit on 03/05/24  POCT urinalysis dipstick   Collection Time: 03/05/24 10:55 AM  Result Value Ref Range   Color, UA Yellow    Clarity, UA Clear    Glucose, UA Negative Negative   Bilirubin, UA Negative    Ketones, UA Negative    Spec Grav, UA 1.015 1.010 - 1.025   Blood, UA Large    pH, UA 7.5 5.0 - 8.0   Protein, UA Positive (A) Negative   Urobilinogen, UA 0.2 0.2 or 1.0 E.U./dL   Nitrite, UA Negative    Leukocytes, UA Trace (A) Negative   Appearance Normal    Odor Small (A)   Urinalysis, microscopic only   Collection Time: 03/05/24 11:49 AM  Result Value Ref Range   WBC, UA 10-20 (A) 0 - 5 /HPF   RBC / HPF > OR = 60 (A) 0 - 2 /HPF   Squamous Epithelial / HPF 10-20 (A) < OR = 5 /HPF   Bacteria, UA FEW (A) NONE SEEN /HPF   Hyaline Cast NONE SEEN NONE SEEN /LPF   Note     + for blood, trace leuks, protein, neg nitrites Will send for microscopy and culture Hx of obstructing stone and sepsis in the past  She has some hot flashes and hasn't felt good, but no focal pain, no fever Discussed the use of AI scribe software for clinical note transcription with the patient, who gave verbal consent to proceed.  History of Present Illness Renee Sanchez is a 33 year old female who presents with urinary symptoms and hot flashes.  Dysuria and urinary abnormalities - Intermittent burning during urination for two weeks, localized to the urethra - Occasional malodorous urine - Urine dipstick positive  for protein, blood, and white blood cells - Hematuria attributed to current menstrual cycle - No vaginal symptoms or evidence of yeast infection - Recent completion of antibiotics for a dental issue; hesitant to start another antibiotic course  Vasomotor symptoms and cognitive changes - Extreme hot flashes for the past week - Associated sensation of restricted breathing and mild sweating - Shortness of breath with minimal exertion - Cognitive symptoms described as 'brain fog' - No fever  Hormone therapy and endocrine status - per specialists  - Currently on hormone therapy including testosterone and progesterone - Improvement in migraines since starting hormone therapy - Last hormonal workup showed slightly elevated testosterone levels  Nephrolithiasis history - Significant episode of kidney stones in 2014 requiring surgical intervention and resulting in severe infection    Patient Active Problem List   Diagnosis Date Noted   Iron deficiency anemia due to chronic blood loss 08/31/2022   Palpitations 09/14/2021   Shortness of breath 09/14/2021   Low ferritin 09/14/2021   Near syncope 09/14/2021   Genetic testing 05/19/2020   Family history of breast cancer    Family history of pancreatic cancer  Family history of ovarian cancer    Family history of colon cancer    NSVD (normal spontaneous vaginal delivery) 04/19/2019   Indication for care in labor and delivery, antepartum 04/18/2019   Retained products of conception, early pregnancy 01/17/2017   Normal labor 08/25/2014   Normal intrauterine pregnancy in third trimester 08/25/2014   SVD (spontaneous vaginal delivery) 08/25/2014   Leukocytosis 02/22/2014   Subchorionic hemorrhage in first trimester 02/22/2014   Hyponatremia 02/22/2014   Hypokalemia 02/22/2014   Normocytic anemia 02/22/2014   Nephrolithiasis 02/21/2014   Left sided abdominal pain 02/21/2014   Currently pregnant 02/21/2014   Nausea and vomiting 02/21/2014       Current Outpatient Medications:    acetaminophen  (TYLENOL ) 500 MG tablet, Take 500 mg by mouth every 6 (six) hours as needed for mild pain., Disp: , Rfl:    cephALEXin  (KEFLEX ) 500 MG capsule, Take 1 capsule (500 mg total) by mouth 2 (two) times daily for 5 days., Disp: 10 capsule, Rfl: 0   Cholecalciferol (VITAMIN D-1000 MAX ST) 25 MCG (1000 UT) tablet, Take by mouth., Disp: , Rfl:    Multiple Vitamin (MULTI-VITAMIN) tablet, Take 1 tablet by mouth daily., Disp: , Rfl:    pantoprazole  (PROTONIX ) 20 MG tablet, Take 1 tablet (20 mg total) by mouth daily., Disp: 30 tablet, Rfl: 11   progesterone  (PROMETRIUM ) 100 MG capsule, Take 1 capsule (100 mg total) by mouth at bedtime., Disp: 90 capsule, Rfl: 1   SUMAtriptan  (IMITREX ) 50 MG tablet, Take 1 tablet (50 mg total) by mouth every 2 (two) hours as needed for migraine. May repeat in 2 hours if headache persists or recurs., Disp: 10 tablet, Rfl: 1   TESTOSTERONE  CYPIONATE IM, Inject 0.2 mLs into the muscle once a week., Disp: , Rfl:   Current Facility-Administered Medications:    0.9 %  sodium chloride  infusion, 500 mL, Intravenous, Once, Stacia, Glendia BRAVO, MD   Allergies  Allergen Reactions   Bupropion      Social History   Tobacco Use   Smoking status: Former   Smokeless tobacco: Never  Vaping Use   Vaping status: Never Used  Substance Use Topics   Alcohol use: Not Currently    Comment: social   Drug use: No      Chart Review Today: I personally reviewed active problem list, medication list, allergies, family history, social history, health maintenance, notes from last encounter, lab results, imaging with the patient/caregiver today.   Review of Systems  Constitutional: Negative.   HENT: Negative.    Eyes: Negative.   Respiratory: Negative.    Cardiovascular: Negative.   Gastrointestinal: Negative.   Endocrine: Negative.   Genitourinary: Negative.   Musculoskeletal: Negative.   Skin: Negative.    Allergic/Immunologic: Negative.   Neurological: Negative.   Hematological: Negative.   Psychiatric/Behavioral: Negative.    All other systems reviewed and are negative.      Objective:   Vitals:   03/05/24 1043  BP: 124/68  Pulse: 77  Resp: 16  Temp: 98.3 F (36.8 C)  SpO2: 98%  Weight: 189 lb (85.7 kg)  Height: 5' 3 (1.6 m)    Body mass index is 33.48 kg/m.  Physical Exam Vitals and nursing note reviewed.  Constitutional:      General: She is not in acute distress.    Appearance: Normal appearance. She is well-developed. She is not ill-appearing, toxic-appearing or diaphoretic.  HENT:     Head: Normocephalic and atraumatic.     Right Ear: External  ear normal.     Left Ear: External ear normal.     Nose: Nose normal.  Eyes:     General: No scleral icterus.       Right eye: No discharge.        Left eye: No discharge.     Conjunctiva/sclera: Conjunctivae normal.  Neck:     Trachea: No tracheal deviation.  Cardiovascular:     Rate and Rhythm: Normal rate and regular rhythm.     Pulses: Normal pulses.     Heart sounds: Normal heart sounds.  Pulmonary:     Effort: Pulmonary effort is normal. No respiratory distress.     Breath sounds: Normal breath sounds. No stridor. No wheezing, rhonchi or rales.  Abdominal:     General: Bowel sounds are normal. There is no distension.     Palpations: Abdomen is soft.     Tenderness: There is no abdominal tenderness. There is no right CVA tenderness, left CVA tenderness, guarding or rebound.  Skin:    General: Skin is warm and dry.     Findings: No rash.  Neurological:     Mental Status: She is alert.     Motor: No abnormal muscle tone.     Coordination: Coordination normal.     Gait: Gait normal.  Psychiatric:        Mood and Affect: Mood normal.        Behavior: Behavior normal.      Results for orders placed or performed in visit on 03/05/24  POCT urinalysis dipstick   Collection Time: 03/05/24 10:55 AM  Result  Value Ref Range   Color, UA Yellow    Clarity, UA Clear    Glucose, UA Negative Negative   Bilirubin, UA Negative    Ketones, UA Negative    Spec Grav, UA 1.015 1.010 - 1.025   Blood, UA Large    pH, UA 7.5 5.0 - 8.0   Protein, UA Positive (A) Negative   Urobilinogen, UA 0.2 0.2 or 1.0 E.U./dL   Nitrite, UA Negative    Leukocytes, UA Trace (A) Negative   Appearance Normal    Odor Small (A)   Urinalysis, microscopic only   Collection Time: 03/05/24 11:49 AM  Result Value Ref Range   WBC, UA 10-20 (A) 0 - 5 /HPF   RBC / HPF > OR = 60 (A) 0 - 2 /HPF   Squamous Epithelial / HPF 10-20 (A) < OR = 5 /HPF   Bacteria, UA FEW (A) NONE SEEN /HPF   Hyaline Cast NONE SEEN NONE SEEN /LPF   Note         Assessment & Plan:     ICD-10-CM   1. Dysuria  R30.0 Urine Culture    POCT urinalysis dipstick    Urinalysis, microscopic only    cephALEXin (KEFLEX) 500 MG capsule   culture and microscopy ordered, tx empirically with odor and other constitutional sx, may be UTI, doubt pyelo or nephrolithiasis based on exam and hx    2. History of UTI  Z87.440 Urinalysis, microscopic only    cephALEXin (KEFLEX) 500 MG capsule    3. History of kidney stones  Z87.442 Urinalysis, microscopic only    cephALEXin (KEFLEX) 500 MG capsule    4. Hot flashes  R23.2    on hormones from specialists so hormonal testing/q's defer to them, may be some signs/sx due to infection hope to see improve      Assessment & Plan Urinary tract infection  in patient with history of nephrolithiasis and prior Clostridioides difficile infection Symptoms and urine dipstick suggest UTI. Differential includes UTI versus nephrolithiasis. Considered prior C. difficile infection when prescribing antibiotics. Initiated empiric antibiotic treatment due to prolonged symptoms. - Prescribed cephalexin  500 mg twice daily for 5 days, option to stop after 3 days if symptoms resolve. - Ordered urine culture to confirm UTI and assess bacterial  resistance. - Advised to monitor symptoms and report adverse reactions or persistence. - Discussed potential antibiotic side effects and history of C. difficile infection. - Advised to contact if yeast infection symptoms occur.  Recording duration: 17 minutes        Michelene Cower, PA-C 03/06/24 12:20 PM

## 2024-03-06 ENCOUNTER — Ambulatory Visit

## 2024-03-06 ENCOUNTER — Other Ambulatory Visit (HOSPITAL_COMMUNITY)
Admission: RE | Admit: 2024-03-06 | Discharge: 2024-03-06 | Disposition: A | Discharge status: Home / Self Care | Source: Ambulatory Visit | Attending: Family Medicine | Admitting: Family Medicine

## 2024-03-06 DIAGNOSIS — R61 Generalized hyperhidrosis: Secondary | ICD-10-CM | POA: Insufficient documentation

## 2024-03-06 DIAGNOSIS — R350 Frequency of micturition: Secondary | ICD-10-CM | POA: Insufficient documentation

## 2024-03-06 DIAGNOSIS — R002 Palpitations: Secondary | ICD-10-CM | POA: Insufficient documentation

## 2024-03-06 DIAGNOSIS — R319 Hematuria, unspecified: Secondary | ICD-10-CM | POA: Insufficient documentation

## 2024-03-06 LAB — URINALYSIS, MICROSCOPIC ONLY
Hyaline Cast: NONE SEEN /LPF
RBC / HPF: >=60 /HPF — AB (ref 0–2)

## 2024-03-07 ENCOUNTER — Ambulatory Visit (HOSPITAL_BASED_OUTPATIENT_CLINIC_OR_DEPARTMENT_OTHER)
Admission: RE | Admit: 2024-03-07 | Discharge: 2024-03-07 | Disposition: A | Discharge status: Home / Self Care | Source: Ambulatory Visit | Attending: Family Medicine | Admitting: Family Medicine

## 2024-03-07 DIAGNOSIS — R109 Unspecified abdominal pain: Secondary | ICD-10-CM | POA: Diagnosis not present

## 2024-03-07 DIAGNOSIS — Z87442 Personal history of urinary calculi: Secondary | ICD-10-CM | POA: Diagnosis not present

## 2024-03-07 DIAGNOSIS — R319 Hematuria, unspecified: Secondary | ICD-10-CM | POA: Insufficient documentation

## 2024-03-07 DIAGNOSIS — R350 Frequency of micturition: Secondary | ICD-10-CM | POA: Insufficient documentation

## 2024-03-07 DIAGNOSIS — R61 Generalized hyperhidrosis: Secondary | ICD-10-CM | POA: Diagnosis not present

## 2024-03-07 DIAGNOSIS — R002 Palpitations: Secondary | ICD-10-CM | POA: Insufficient documentation

## 2024-03-08 LAB — URINE CULTURE
MICRO NUMBER:: 16832656
SPECIMEN QUALITY:: ADEQUATE

## 2024-03-09 ENCOUNTER — Ambulatory Visit: Payer: Self-pay | Admitting: Family Medicine

## 2024-03-09 ENCOUNTER — Other Ambulatory Visit: Payer: Self-pay

## 2024-03-09 ENCOUNTER — Telehealth: Payer: Self-pay

## 2024-03-09 ENCOUNTER — Other Ambulatory Visit (HOSPITAL_COMMUNITY)
Admission: AD | Admit: 2024-03-09 | Discharge: 2024-03-09 | Disposition: A | Discharge status: Home / Self Care | Source: Ambulatory Visit | Attending: Family Medicine | Admitting: Family Medicine

## 2024-03-09 ENCOUNTER — Other Ambulatory Visit (HOSPITAL_COMMUNITY): Payer: Self-pay

## 2024-03-09 DIAGNOSIS — R350 Frequency of micturition: Secondary | ICD-10-CM

## 2024-03-09 DIAGNOSIS — Z87442 Personal history of urinary calculi: Secondary | ICD-10-CM | POA: Diagnosis not present

## 2024-03-09 DIAGNOSIS — R319 Hematuria, unspecified: Secondary | ICD-10-CM | POA: Insufficient documentation

## 2024-03-09 DIAGNOSIS — R232 Flushing: Secondary | ICD-10-CM

## 2024-03-09 DIAGNOSIS — R002 Palpitations: Secondary | ICD-10-CM | POA: Diagnosis not present

## 2024-03-09 DIAGNOSIS — R61 Generalized hyperhidrosis: Secondary | ICD-10-CM | POA: Diagnosis not present

## 2024-03-09 LAB — CBC WITH DIFFERENTIAL/PLATELET
Abs Immature Granulocytes: 0.04 K/uL (ref 0.00–0.07)
Basophils Absolute: 0 K/uL (ref 0.0–0.1)
Basophils Relative: 1 %
Eosinophils Absolute: 0.1 K/uL (ref 0.0–0.5)
Eosinophils Relative: 2 %
HCT: 41.2 % (ref 36.0–46.0)
Hemoglobin: 14 g/dL (ref 12.0–15.0)
Immature Granulocytes: 1 %
Lymphocytes Relative: 24 %
Lymphs Abs: 1.8 K/uL (ref 0.7–4.0)
MCH: 30.4 pg (ref 26.0–34.0)
MCHC: 34 g/dL (ref 30.0–36.0)
MCV: 89.4 fL (ref 80.0–100.0)
Monocytes Absolute: 0.5 K/uL (ref 0.1–1.0)
Monocytes Relative: 7 %
Neutro Abs: 5.1 K/uL (ref 1.7–7.7)
Neutrophils Relative %: 65 %
Platelets: 276 K/uL (ref 150–400)
RBC: 4.61 MIL/uL (ref 3.87–5.11)
RDW: 11.9 % (ref 11.5–15.5)
WBC: 7.6 K/uL (ref 4.0–10.5)
nRBC: 0 % (ref 0.0–0.2)

## 2024-03-09 LAB — COMPREHENSIVE METABOLIC PANEL WITH GFR
ALT: 16 U/L (ref 0–44)
AST: 18 U/L (ref 15–41)
Albumin: 4.2 g/dL (ref 3.5–5.0)
Alkaline Phosphatase: 66 U/L (ref 38–126)
Anion gap: 11 (ref 5–15)
BUN: 9 mg/dL (ref 6–20)
CO2: 26 mmol/L (ref 22–32)
Calcium: 9.6 mg/dL (ref 8.9–10.3)
Chloride: 101 mmol/L (ref 98–111)
Creatinine, Ser: 0.88 mg/dL (ref 0.44–1.00)
GFR, Estimated: >60 mL/min (ref >60)
Glucose, Bld: 91 mg/dL (ref 70–99)
Potassium: 3.9 mmol/L (ref 3.5–5.1)
Sodium: 138 mmol/L (ref 135–145)
Total Bilirubin: 0.6 mg/dL (ref 0.0–1.2)
Total Protein: 7.4 g/dL (ref 6.5–8.1)

## 2024-03-09 MED ORDER — NITROFURANTOIN MONOHYD MACRO 100 MG PO CAPS
100.0000 mg | ORAL_CAPSULE | Freq: Two times a day (BID) | ORAL | 0 refills | Status: DC
  Filled 2024-03-09: qty 10, 5d supply, fill #0

## 2024-03-09 NOTE — Telephone Encounter (Signed)
 Call returned to [facility] in regards to a specimen that had not resulted. Informed patient specimen was lost in transit and back tube had clotted. New order placed for patient to have labs redrawn.

## 2024-03-09 NOTE — Telephone Encounter (Unsigned)
 Copied from CRM [id number]. Topic: General - Other >> Mar 09, 2024  9:35 AM [place] E wrote: Reason for CRM: Dorene with [facility] was returning a call from [facility] in regards to the patient. Callback number for Dorene is [phone].

## 2024-03-10 ENCOUNTER — Ambulatory Visit: Payer: Self-pay

## 2024-03-10 LAB — MISC LABCORP TEST (SEND OUT): Labcorp test code: 620

## 2024-03-13 ENCOUNTER — Other Ambulatory Visit: Payer: Self-pay

## 2024-03-13 DIAGNOSIS — Z87442 Personal history of urinary calculi: Secondary | ICD-10-CM

## 2024-03-24 ENCOUNTER — Encounter: Payer: Self-pay | Admitting: Internal Medicine

## 2024-03-27 DIAGNOSIS — Z87440 Personal history of urinary (tract) infections: Secondary | ICD-10-CM | POA: Insufficient documentation

## 2024-03-27 NOTE — Assessment & Plan Note (Addendum)
 Punctate left nonobstructive renal stones Measuring 1 to 3 mm at most Recurrent nephrolithiasis-since 2014, no prior workup  I reviewed her clinical history and recent CT imaging.  These findings are reassuring that she has not developed significant stone burden over the last 10 years since her initial stone surgery in 2014.    - Intervention not recommended of stones of this size and location - Reviewed the basic tenets of stone physiology and kidney stone prevention - Proceed with metabolic stone workup/24-hour urine - RTC once complete

## 2024-03-27 NOTE — Progress Notes (Signed)
 03/27/24 3:39 PM   Renee Sanchez Renee Sanchez [birth year] [id number]  CC: Urinary tract infection   HPI: 33 year old female here for initial evaluation of dysuria, history of nephrolithiasis History of recurrent nephrolithiasis since 2014 History of prior ureteroscopy (~2014) Multiple stone events since then, all spontaneously passed-although severe symptoms/ED visits  CT stone (03/07/2024)-punctate nonobstructive left renal stones measuring up to 3 mm, no hydronephrosis Urine culture (03/05/2024)-E. coli resistant to Augmentin ampicillin and cefazolin  History of nephrolithiasis in 2014-reportedly underwent right URS for a 5mm ureteral stone Family history of nephrolithiasis in both parents, grandfather    PMH: Past Medical History:  Diagnosis Date   ADD (attention deficit disorder)    Amenorrhea    Anemia    Anxiety    Chronic fatigue    Depression    Family history of breast cancer    Family history of colon cancer    Family history of ovarian cancer    Family history of pancreatic cancer    GERD (gastroesophageal reflux disease)    History of kidney stones    Insomnia    Kidney stone 09/02/2021   Late menses    Low iron     Macular degeneration disease    Migraine    Missed period    Nephrolithiasis    Normal intrauterine pregnancy in third trimester 08/25/2014   Palpitations    Panic disorder    Retained products of conception, early pregnancy 01/17/2017    Surgical History: Past Surgical History:  Procedure Laterality Date   DILATION AND CURETTAGE OF UTERUS  01/14/2017   kidney stent     kidney stent removal and removal of kidney stone     UPPER GASTROINTESTINAL ENDOSCOPY  03/29/2023   [facility] at [facility]    Family History: Family History  Problem Relation Age of Onset   Varicose Veins Mother    ADD / ADHD Father    Alcohol abuse Father    Drug abuse Father    Anxiety disorder Father    Depression Father    ADD / ADHD Brother    Uterine cancer  Paternal Aunt    Cervical cancer Paternal Aunt        dx 19s, genetic test + for colon gene   Pancreatic cancer Paternal Uncle 37   Breast cancer Maternal Grandmother        late 60s-70s   Arthritis Paternal Grandmother    COPD Paternal Grandmother    Diabetes Paternal Grandmother    Breast cancer Paternal Grandmother 34   Cancer Paternal Grandmother        fallopian tube dx 28   Cancer - Other Paternal Grandmother        tongue cancer dx 14   Colon polyps Neg Hx    Colon cancer Neg Hx    Esophageal cancer Neg Hx    Rectal cancer Neg Hx    Stomach cancer Neg Hx     Social History:  reports that she has quit smoking. She has never used smokeless tobacco. She reports that she does not currently use alcohol. She reports that she does not use drugs.      Physical Exam: LMP 03/05/2024    Constitutional:  Alert and oriented, No acute distress. Cardiovascular: No clubbing, cyanosis, or edema. Respiratory: Normal respiratory effort, no increased work of breathing. GI: Nondistended Skin: No rashes, bruises or suspicious lesions. Neurologic: Grossly intact, no focal deficits, moving all 4 extremities. Psychiatric: Normal mood and affect.  Laboratory Data: Component Ref  Range & Units (hover) 3 wk ago  MICRO NUMBER: [id number]  SPECIMEN QUALITY: Adequate  Sample Source URINE  STATUS: FINAL  ISOLATE 1: Escherichia coli Abnormal   Comment: 10,000-49,000 CFU/mL of Escherichia coli  Resulting Agency [facility] [place]     Susceptibility   Escherichia coli    URINE CULTURE, REFLEX    AMOX/CLAVULANIC >=32 Resistant    AMPICILLIN/SULBACTAM >=32 Resistant    CEFAZOLIN 8 Resistant 1    CEFEPIME <=0.12 Sensitive    CEFTAZIDIME <=0.5 Sensitive    CEFTRIAXONE  <=0.25 Sensitive    CIPROFLOXACIN <=0.06 Sensitive    GENTAMICIN <=1 Sensitive    IMIPENEM <=0.25 Sensitive    LEVOFLOXACIN <=0.12 Sensitive    MEROPENEM <=0.25 Sensitive    NITROFURANTOIN  <=16 Sensitive     PIP/TAZO 8 Sensitive    TRIMETH/SULFA <=20 Sensitive 2         Pertinent Imaging: I have personally viewed and interpreted the CT stone (03/07/2024) -agree with read, punctate left renal calcifications measuring up to 1 to 3 mm, no stones on the right, otherwise morphologically normal bilateral kidneys and ureter.    Assessment & Plan:    Nephrolithiasis Assessment & Plan: Punctate left nonobstructive renal stones Measuring 1 to 3 mm at most  I reviewed her clinical history and recent CT imaging.  These findings are reassuring that she has not developed significant stone burden over the last 10 years since her initial stone surgery in 2014.    - Intervention not recommended of stones of this size and location - Reviewed the basic tenets of stone physiology and kidney stone prevention   History of urinary tract infection Assessment & Plan: Recent E. coli UTI in August 2025 No significant culture data, does not meet current UTI threshold  I did review recurrent UTI management with her just the same.  I am not overly worried by her infrequent localized infections, which can be common in this age group.  We reviewed the core principles of recurrent UTI management in women, including relevant physiology, proper diagnostic evaluation, and the full spectrum of treatment options. I outlined the range of management strategies, noting the varying degrees of evidence supporting each. For many women, attention to lifestyle and conservative behavioral measures can significantly reduce infection frequency and improve quality of life. First-line steps include maintaining adequate hydration, avoiding constipation, and practicing proper bladder hygiene. Over-the-counter supplements such as cranberry juice or extracts may be incorporated into the diet, as they have demonstrated benefit in reducing UTI frequency. Other strategies with more limited evidence--but low risk and ease of implementation--include  D-mannose and probiotics.  For postmenopausal women, we discussed the use of vaginal estrogen, its role in addressing vaginal atrophy, restoring urethrogenital flora, and its evidence-based benefit in reducing recurrent UTIs. In refractory cases, options may include a trial of suppressive antibiotic therapy with low-dose daily administration. Alternative approaches include post-coital antibiotic prophylaxis or a "pill-in-the-pocket" regimen for self-treatment in select uncomplicated cases.        Penne Skye, MD 03/27/2024  [facility] [address] [place], [place] [address] [phone]

## 2024-03-27 NOTE — Assessment & Plan Note (Signed)
 Recent E. coli UTI in August 2025 No significant culture data, does not meet current UTI threshold  I did review recurrent UTI management with her just the same.  I am not overly worried by her infrequent localized infections, which can be common in this age group.  We reviewed the core principles of recurrent UTI management in women, including relevant physiology, proper diagnostic evaluation, and the full spectrum of treatment options. I outlined the range of management strategies, noting the varying degrees of evidence supporting each. For many women, attention to lifestyle and conservative behavioral measures can significantly reduce infection frequency and improve quality of life. First-line steps include maintaining adequate hydration, avoiding constipation, and practicing proper bladder hygiene. Over-the-counter supplements such as cranberry juice or extracts may be incorporated into the diet, as they have demonstrated benefit in reducing UTI frequency. Other strategies with more limited evidence--but low risk and ease of implementation--include D-mannose and probiotics.  For postmenopausal women, we discussed the use of vaginal estrogen, its role in addressing vaginal atrophy, restoring urethrogenital flora, and its evidence-based benefit in reducing recurrent UTIs. In refractory cases, options may include a trial of suppressive antibiotic therapy with low-dose daily administration. Alternative approaches include post-coital antibiotic prophylaxis or a "pill-in-the-pocket" regimen for self-treatment in select uncomplicated cases.

## 2024-04-03 ENCOUNTER — Ambulatory Visit: Admitting: Urology

## 2024-04-03 VITALS — BP 122/81 | HR 95 | Ht 63.0 in | Wt 181.0 lb

## 2024-04-03 DIAGNOSIS — Z87440 Personal history of urinary (tract) infections: Secondary | ICD-10-CM

## 2024-04-03 DIAGNOSIS — N200 Calculus of kidney: Secondary | ICD-10-CM

## 2024-04-03 NOTE — Patient Instructions (Addendum)
[facility] Instructions [facility]   You will receive a box/kit in the mail that will have a urine jug and instructions in the kit.  When the box arrives you will need to call our office [phone] to schedule a LAB appointment.   You will need to do a 24hour urine and this should be done during the days that our office will be open.  For example any day from Sunday through Thursday.   If you take Vitamin C 100mg  or greater please stop this 5 days prior to collection.   How to collect the urine sample: On the day you start the urine sample this 1st morning urine should NOT be collected.  For the rest of the day including all night urines should be collected.  On the next morning the 1st urine should be collected and then you will be finished with the urine collections.   You will need to bring the box with you on your LAB appointment day after urine has been collected and all instructions are complete in the box.  Your blood will be drawn and the box will be collected by our Lab employee to be sent off for analysis.   When urine and blood is complete you will need to schedule a follow up appointment for lab results.     Dietary Guidelines to Help Prevent Kidney Stones Kidney stones are deposits of minerals and salts that form inside your kidneys. Your risk of developing kidney stones may be greater depending on your diet, your lifestyle, the medicines you take, and whether you have certain medical conditions. Most people can lower their risks of developing kidney stones by following these dietary guidelines. Your dietitian may give you more specific instructions depending on your overall health and the type of kidney stones you tend to develop. What are tips for following this plan? Reading food labels  Choose foods with "no salt added" or "low-salt" labels. Limit your salt (sodium) intake to less than 1,500 mg a day. Choose foods with calcium for each meal and snack.  Try to eat about 300 mg of calcium at each meal. Foods that contain 200-500 mg of calcium a serving include: 8 oz (237 mL) of milk, calcium-fortifiednon-dairy milk, and calcium-fortifiedfruit juice. Calcium-fortified means that calcium has been added to these drinks. 8 oz (237 mL) of kefir, yogurt, and soy yogurt. 4 oz (114 g) of tofu. 1 oz (28 g) of cheese. 1 cup (150 g) of dried figs. 1 cup (91 g) of cooked broccoli. One 3 oz (85 g) can of sardines or mackerel. Most people need 1,000-1,500 mg of calcium a day. Talk to your dietitian about how much calcium is recommended for you. Shopping Buy plenty of fresh fruits and vegetables. Most people do not need to avoid fruits and vegetables, even if these foods contain nutrients that may contribute to kidney stones. When shopping for convenience foods, choose: Whole pieces of fruit. Pre-made salads with dressing on the side. Low-fat fruit and yogurt smoothies. Avoid buying frozen meals or prepared deli foods. These can be high in sodium. Look for foods with live cultures, such as yogurt and kefir. Choose high-fiber grains, such as whole-wheat breads, oat bran, and wheat cereals. Cooking Do not add salt to food when cooking. Place a salt shaker on the table and allow each person to add their own salt to taste. Use vegetable protein, such as beans, textured vegetable protein (TVP), or tofu, instead of meat in pasta, casseroles, and  soups. Meal planning Eat less salt, if told by your dietitian. To do this: Avoid eating processed or pre-made food. Avoid eating fast food. Eat less animal protein, including cheese, meat, poultry, or fish, if told by your dietitian. To do this: Limit the number of times you have meat, poultry, fish, or cheese each week. Eat a diet free of meat at least 2 days a week. Eat only one serving each day of meat, poultry, fish, or seafood. When you prepare animal proteins, cut pieces into small portion sizes. For most meat  and fish, one serving is about the size of the palm of your hand. Eat at least five servings of fresh fruits and vegetables each day. To do this: Keep fruits and vegetables on hand for snacks. Eat one piece of fruit or a handful of berries with breakfast. Have a salad and fruit at lunch. Have two kinds of vegetables at dinner. You may be told to limit foods that are high in a substance called oxalate. These include: Spinach (cooked), rhubarb, beets, sweet potatoes, and Swiss chard. Peanuts. Potato chips, french fries, and baked potatoes with skin on. Nuts and nut products. Chocolate. If you regularly take a diuretic medicine, make sure to eat at least 1 or 2 servings of fruits or vegetables that are high in potassium each day. These include: Avocado. Banana. Orange, prune, carrot, or tomato juice. Baked potato. Cabbage. Beans and split peas. Lifestyle  Drink enough fluid to keep your urine pale yellow. This is the most important thing you can do. Spread your fluid intake throughout the day. If you drink alcohol: Limit how much you have to: 0-1 drink a day for women who are not pregnant. 0-2 drinks a day for men. Know how much alcohol is in your drink. In the [place] one drink equals one 12 oz bottle of beer (355 mL), one 5 oz glass of wine (148 mL), or one 1 oz glass of hard liquor (44 mL). Lose weight if told by your health care provider. Work with your dietitian to find an eating plan and weight loss strategies that work best for you. General information Talk to your health care provider and dietitian about taking daily supplements. Depending on your health and the cause of your kidney stones, you may be told: Do not take high-dose supplements of vitamin C (1,000 mg a day or more). To take a calcium supplement. To take a daily probiotic supplement. To take other supplements such as magnesium, fish oil, or vitamin B6. Take over-the-counter and prescription medicines only as told by  your health care provider. These include supplements. What foods should I limit? Limit your intake of the following foods, or eat them as told by your dietitian. Vegetables Spinach. Rhubarb. Beets. Canned vegetables. Rosita Fire. Olives. Baked potatoes with skin. Grains Wheat bran. Baked goods. Salted crackers. Cereals high in sugar. Meats and other proteins Nuts. Nut butters. Large portions of meat, poultry, or fish. Salted, precooked, or cured meats, such as sausages, meat loaves, and hot dogs. Dairy Cheeses. Beverages Regular soft drinks. Regular vegetable juice. Seasonings and condiments Seasoning blends with salt. Salad dressings. Soy sauce. Ketchup. Barbecue sauce. Other foods Canned soups. Canned pasta sauce. Casseroles. Pizza. Lasagna. Frozen meals. Potato chips. [place] fries. The items listed above may not be a complete list of foods and beverages you should limit. Contact a dietitian for more information. What foods should I avoid? Talk to your dietitian about specific foods you should avoid based on the type of  kidney stones you have and your overall health. Fruits Grapefruit. The item listed above may not be a complete list of foods and beverages you should avoid. Contact a dietitian for more information. Summary Kidney stones are deposits of minerals and salts that form inside your kidneys. You can lower your risk of kidney stones by making changes to your diet. The most important thing you can do is drink enough fluid. Drink enough fluid to keep your urine pale yellow. Talk to your dietitian about how much calcium you should have each day, and eat less salt and animal protein as told by your dietitian. This information is not intended to replace advice given to you by your health care provider. Make sure you discuss any questions you have with your health care provider. Document Revised: 10/19/2021 Document Reviewed: 10/19/2021 Elsevier Patient Education  2024 [employer].

## 2024-05-06 ENCOUNTER — Other Ambulatory Visit: Payer: Self-pay

## 2024-05-06 DIAGNOSIS — D500 Iron deficiency anemia secondary to blood loss (chronic): Secondary | ICD-10-CM

## 2024-05-07 ENCOUNTER — Inpatient Hospital Stay: Attending: Hematology

## 2024-05-19 ENCOUNTER — Ambulatory Visit: Admitting: Internal Medicine

## 2024-05-20 DIAGNOSIS — Z09 Encounter for follow-up examination after completed treatment for conditions other than malignant neoplasm: Secondary | ICD-10-CM | POA: Diagnosis not present

## 2024-05-20 DIAGNOSIS — Z872 Personal history of diseases of the skin and subcutaneous tissue: Secondary | ICD-10-CM | POA: Diagnosis not present

## 2024-05-20 DIAGNOSIS — D1801 Hemangioma of skin and subcutaneous tissue: Secondary | ICD-10-CM | POA: Diagnosis not present

## 2024-06-15 DIAGNOSIS — H35352 Cystoid macular degeneration, left eye: Secondary | ICD-10-CM | POA: Diagnosis not present

## 2024-06-15 DIAGNOSIS — H31092 Other chorioretinal scars, left eye: Secondary | ICD-10-CM | POA: Diagnosis not present

## 2024-06-15 DIAGNOSIS — H52223 Regular astigmatism, bilateral: Secondary | ICD-10-CM | POA: Diagnosis not present

## 2024-06-15 DIAGNOSIS — H5213 Myopia, bilateral: Secondary | ICD-10-CM | POA: Diagnosis not present

## 2024-07-31 ENCOUNTER — Ambulatory Visit (INDEPENDENT_AMBULATORY_CARE_PROVIDER_SITE_OTHER): Admitting: Family Medicine

## 2024-07-31 VITALS — BP 122/79 | HR 78 | Temp 98.4°F | Ht 63.0 in | Wt 172.0 lb

## 2024-07-31 DIAGNOSIS — U071 COVID-19: Secondary | ICD-10-CM

## 2024-07-31 LAB — POC COVID19 BINAXNOW: SARS Coronavirus 2 Ag: POSITIVE — AB

## 2024-07-31 MED ORDER — ALBUTEROL SULFATE (2.5 MG/3ML) 0.083% IN NEBU: 2.5000 mg | INHALATION_SOLUTION | Freq: Four times a day (QID) | RESPIRATORY_TRACT | 1 refills | Status: AC | PRN

## 2024-07-31 NOTE — Patient Instructions (Signed)
 Your symptoms today are most likely being caused by a virus and should steadily improve in time it can take up to 7 to 10 days before you truly start to see a turnaround however things will get better   COVID testing is positive.  You can take Tylenol  and/or Ibuprofen  as needed for fever reduction and pain relief.   For cough: honey 1/2 to 1 teaspoon (you can dilute the honey in water or another fluid).  You can also use guaifenesin and dextromethorphan for cough.   - You can use a humidifier for chest congestion and cough.  If you don't have a humidifier, you can sit in the bathroom with the hot shower running.      For sore throat: try warm salt water gargles, cepacol lozenges, throat spray, warm tea or water with lemon/honey, popsicles or ice, or OTC cold relief medicine for throat discomfort.   For congestion:   - take a daily anti-histamine like Zyrtec (cetirizine), Claritin (loratadine), or Allegra (fexofenadine),   - If you do NOT have high blood pressure, you can also take an oral decongestant, such as pseudoephedrine.    - You can also use Flonase 1-2 sprays in each nostril daily.   It is important to stay hydrated: drink plenty of fluids (water, gatorade/powerade/pedialyte, juices, or teas) to keep your throat moisturized and help further relieve irritation/discomfort.  These should be sugar-free if you are diabetic.

## 2024-07-31 NOTE — Progress Notes (Signed)
 "     Established patient visit   Patient: Renee Sanchez   DOB: [birth year]   34 y.o. Female  MRN: [id number] Visit Date: 07/31/2024  Today's healthcare provider: LAURAINE LOISE BUOY, DO   Chief Complaint  Patient presents with   Sore Throat   Cough    Slight production, some SOB. Symptoms for 3 days.  Lots of sinus pressure, dry throat and nose   Subjective    Sore Throat  Associated symptoms include congestion, coughing, headaches and shortness of breath.  Cough Associated symptoms include headaches, myalgias, rhinorrhea, a sore throat and shortness of breath. Pertinent negatives include no chills or fever.    Renee Sanchez is a 34 year old female who presents with symptoms of COVID-19.  She has been experiencing symptoms consistent with COVID-19 for the past three days. Initially, she noticed a sensation of a 'lump in her throat' when swallowing, which progressed to shortness of breath, sinus pressure, and a runny nose that has since become more dry. She also has a cough that is not productive enough to expel mucus and experiences profuse sweating and shortness of breath with minimal exertion, such as doing dishes.  She experiences body aches but has no fever or chills. She has headaches and dizziness, particularly when moving around, describing it as more of an 'exhaustion feeling' rather than room spinning or balance issues. She has no nausea, vomiting, abdominal pain, or diarrhea, but notes a lack of appetite.  She has access to a nebulizer, which belongs to her husband, but she has never used it herself.  We do have a new set of tubing and mouthpiece that she can use to keep her use separate from her husband.  She has been taking 600 mg of ibuprofen  intermittently and 1000 mg of Tylenol  intermittently for symptom relief. She is cautious about her kidney health due to a history of kidney stones.      Medications: Show/hide medication list[1]  Review of Systems   Constitutional:  Positive for diaphoresis and fatigue. Negative for chills and fever.  HENT:  Positive for congestion, rhinorrhea, sinus pressure and sore throat. Negative for sinus pain.   Respiratory:  Positive for cough and shortness of breath.   Musculoskeletal:  Positive for myalgias.  Neurological:  Positive for light-headedness and headaches. Negative for dizziness.        Objective    BP 122/79 (BP Location: Right Arm, Patient Position: Sitting, Cuff Size: Normal)   Pulse 78   Temp 98.4 F (36.9 C) (Oral)   Ht 5' 3 (1.6 m)   Wt 172 lb (78 kg)   SpO2 96%   BMI 30.47 kg/m     Physical Exam Vitals reviewed.  Constitutional:      General: She is not in acute distress.    Appearance: Normal appearance. She is well-developed. She is not diaphoretic.  HENT:     Head: Normocephalic and atraumatic.     Right Ear: Tympanic membrane, ear canal and external ear normal.     Left Ear: Tympanic membrane, ear canal and external ear normal.     Nose: Congestion and rhinorrhea present.     Mouth/Throat:     Mouth: Mucous membranes are moist.     Pharynx: Oropharynx is clear. No oropharyngeal exudate.  Eyes:     General: No scleral icterus.    Conjunctiva/sclera: Conjunctivae normal.     Pupils: Pupils are equal, round, and reactive to light.  Cardiovascular:  Rate and Rhythm: Normal rate and regular rhythm.     Pulses: Normal pulses.     Heart sounds: Normal heart sounds. No murmur heard. Pulmonary:     Effort: Pulmonary effort is normal. No respiratory distress.     Breath sounds: Normal breath sounds. No wheezing or rales.  Musculoskeletal:     Cervical back: Neck supple.     Right lower leg: No edema.     Left lower leg: No edema.  Lymphadenopathy:     Cervical: No cervical adenopathy.  Skin:    General: Skin is warm and dry.     Findings: No rash.  Neurological:     Mental Status: She is alert.      Results for orders placed or performed in visit on  07/31/24  POC COVID-19  Result Value Ref Range   SARS Coronavirus 2 Ag Positive (A) Negative    Assessment & Plan    COVID-19 virus RNA test result positive at limit of detection -     Albuterol  Sulfate; Take 3 mLs (2.5 mg total) by nebulization every 6 (six) hours as needed for wheezing or shortness of breath.  Dispense: 150 mL; Refill: 1 -     POC COVID-19 BinaxNow  Confirmed COVID-19 with respiratory and systemic symptoms. No fever or gastrointestinal symptoms. No risk factors for Paxlovid. Prefers supportive care. - Prescribed albuterol  nebulizer for shortness of breath. - Advised supportive care with adequate fluid intake and rest. - Recommended honey in tea or warm water for sore throat. - Suggested salt water gargles for sore throat. - Discussed use of Cepacol lozenges for sore throat. - Advised Tylenol  1000 mg TID PRN (maximum dose) and ibuprofen  600 mg QID PRN (maximum dose) for symptom relief.    Return if symptoms worsen or fail to improve.      I discussed the assessment and treatment plan with the patient  The patient was provided an opportunity to ask questions and all were answered. The patient agreed with the plan and demonstrated an understanding of the instructions.   The patient was advised to call back or seek an in-person evaluation if the symptoms worsen or if the condition fails to improve as anticipated.    LAURAINE LOISE BUOY, DO  [facility] [phone] (phone) [phone] (fax)  [facility]     [1]  Outpatient Medications Prior to Visit  Medication Sig   acetaminophen  (TYLENOL ) 500 MG tablet Take 500 mg by mouth every 6 (six) hours as needed for mild pain.   Cholecalciferol (VITAMIN D-1000 MAX ST) 25 MCG (1000 UT) tablet Take by mouth.   Multiple Vitamin (MULTI-VITAMIN) tablet Take 1 tablet by mouth daily.   pantoprazole  (PROTONIX ) 20 MG tablet Take 1 tablet (20 mg total) by mouth daily.   progesterone   (PROMETRIUM ) 100 MG capsule Take 1 capsule (100 mg total) by mouth at bedtime.   SUMAtriptan  (IMITREX ) 50 MG tablet Take 1 tablet (50 mg total) by mouth every 2 (two) hours as needed for migraine. May repeat in 2 hours if headache persists or recurs.   TESTOSTERONE  CYPIONATE IM Inject 0.2 mLs into the muscle once a week.   [DISCONTINUED] nitrofurantoin , macrocrystal-monohydrate, (MACROBID ) 100 MG capsule Take 1 capsule (100 mg total) by mouth 2 (two) times daily.   Facility-Administered Medications Prior to Visit  Medication Dose Route Frequency Provider   0.9 %  sodium chloride  infusion  500 mL Intravenous Once Cunningham, Scott E, MD   "

## 2024-08-03 ENCOUNTER — Other Ambulatory Visit (HOSPITAL_COMMUNITY): Payer: Self-pay

## 2024-08-03 ENCOUNTER — Encounter (HOSPITAL_COMMUNITY): Payer: Self-pay

## 2024-08-07 ENCOUNTER — Other Ambulatory Visit (HOSPITAL_COMMUNITY): Payer: Self-pay

## 2024-08-07 ENCOUNTER — Inpatient Hospital Stay: Attending: Hematology

## 2024-08-10 ENCOUNTER — Other Ambulatory Visit (HOSPITAL_COMMUNITY): Payer: Self-pay

## 2024-11-05 ENCOUNTER — Inpatient Hospital Stay: Attending: Hematology

## 2025-02-04 ENCOUNTER — Inpatient Hospital Stay: Attending: Hematology

## 2025-02-04 ENCOUNTER — Inpatient Hospital Stay: Admitting: Hematology
